# Patient Record
Sex: Male | Born: 1945 | Race: White | Hispanic: No | Smoking: Current every day smoker
Health system: Southern US, Community
[De-identification: ages and names within clinical notes are randomized; demographics above are authoritative.]

## PROBLEM LIST (undated history)

## (undated) DIAGNOSIS — J449 Chronic obstructive pulmonary disease, unspecified: Secondary | ICD-10-CM

## (undated) DIAGNOSIS — I499 Cardiac arrhythmia, unspecified: Secondary | ICD-10-CM

## (undated) DIAGNOSIS — I509 Heart failure, unspecified: Secondary | ICD-10-CM

## (undated) DIAGNOSIS — I739 Peripheral vascular disease, unspecified: Secondary | ICD-10-CM

## (undated) DIAGNOSIS — E119 Type 2 diabetes mellitus without complications: Secondary | ICD-10-CM

## (undated) DIAGNOSIS — M1990 Unspecified osteoarthritis, unspecified site: Secondary | ICD-10-CM

## (undated) DIAGNOSIS — K219 Gastro-esophageal reflux disease without esophagitis: Secondary | ICD-10-CM

## (undated) DIAGNOSIS — F329 Major depressive disorder, single episode, unspecified: Secondary | ICD-10-CM

## (undated) DIAGNOSIS — I639 Cerebral infarction, unspecified: Secondary | ICD-10-CM

## (undated) DIAGNOSIS — I4891 Unspecified atrial fibrillation: Secondary | ICD-10-CM

## (undated) DIAGNOSIS — J189 Pneumonia, unspecified organism: Secondary | ICD-10-CM

## (undated) DIAGNOSIS — I219 Acute myocardial infarction, unspecified: Secondary | ICD-10-CM

## (undated) DIAGNOSIS — I480 Paroxysmal atrial fibrillation: Secondary | ICD-10-CM

## (undated) DIAGNOSIS — E785 Hyperlipidemia, unspecified: Secondary | ICD-10-CM

## (undated) DIAGNOSIS — J45909 Unspecified asthma, uncomplicated: Secondary | ICD-10-CM

## (undated) DIAGNOSIS — I2510 Atherosclerotic heart disease of native coronary artery without angina pectoris: Secondary | ICD-10-CM

## (undated) DIAGNOSIS — R51 Headache: Secondary | ICD-10-CM

## (undated) DIAGNOSIS — H269 Unspecified cataract: Secondary | ICD-10-CM

## (undated) DIAGNOSIS — F32A Depression, unspecified: Secondary | ICD-10-CM

## (undated) DIAGNOSIS — I10 Essential (primary) hypertension: Secondary | ICD-10-CM

## (undated) DIAGNOSIS — R519 Headache, unspecified: Secondary | ICD-10-CM

## (undated) DIAGNOSIS — I255 Ischemic cardiomyopathy: Secondary | ICD-10-CM

## (undated) DIAGNOSIS — F419 Anxiety disorder, unspecified: Secondary | ICD-10-CM

## (undated) HISTORY — PX: FRACTURE SURGERY: SHX138

## (undated) HISTORY — PX: CORONARY STENT PLACEMENT: SHX1402

## (undated) HISTORY — PX: KNEE SURGERY: SHX244

## (undated) HISTORY — PX: OTHER SURGICAL HISTORY: SHX169

## (undated) HISTORY — PX: FACIAL FRACTURE SURGERY: SHX1570

## (undated) HISTORY — PX: FOOT SURGERY: SHX648

## (undated) HISTORY — PX: COLONOSCOPY W/ BIOPSIES AND POLYPECTOMY: SHX1376

---

## 2008-09-24 HISTORY — PX: CARDIAC CATHETERIZATION: SHX172

## 2009-06-04 ENCOUNTER — Ambulatory Visit: Payer: Self-pay | Admitting: Cardiovascular Disease

## 2009-06-04 ENCOUNTER — Encounter (INDEPENDENT_AMBULATORY_CARE_PROVIDER_SITE_OTHER): Payer: Self-pay | Admitting: Cardiology

## 2009-06-04 ENCOUNTER — Inpatient Hospital Stay (HOSPITAL_COMMUNITY): Admission: EM | Admit: 2009-06-04 | Discharge: 2009-06-08 | Payer: Self-pay | Admitting: Emergency Medicine

## 2010-03-17 ENCOUNTER — Encounter: Payer: Self-pay | Admitting: Surgery

## 2010-05-15 LAB — LIPID PANEL
HDL: 60 mg/dL (ref >39)
Total CHOL/HDL Ratio: 3.6 RATIO
Triglycerides: 256 mg/dL — ABNORMAL HIGH (ref <150)
VLDL: 51 mg/dL — ABNORMAL HIGH (ref 0–40)

## 2010-05-15 LAB — COMPREHENSIVE METABOLIC PANEL
ALT: 28 U/L (ref 0–53)
Alkaline Phosphatase: 45 U/L (ref 39–117)
BUN: 11 mg/dL (ref 6–23)
CO2: 24 mEq/L (ref 19–32)
Chloride: 105 mEq/L (ref 96–112)
Glucose, Bld: 128 mg/dL — ABNORMAL HIGH (ref 70–99)
Potassium: 4 mEq/L (ref 3.5–5.1)
Sodium: 137 mEq/L (ref 135–145)
Total Bilirubin: 0.6 mg/dL (ref 0.3–1.2)
Total Protein: 6.2 g/dL (ref 6.0–8.3)

## 2010-05-15 LAB — HEPARIN LEVEL (UNFRACTIONATED)
Heparin Unfractionated: 0.23 IU/mL — ABNORMAL LOW (ref 0.30–0.70)
Heparin Unfractionated: 0.3 IU/mL (ref 0.30–0.70)
Heparin Unfractionated: 0.53 IU/mL (ref 0.30–0.70)
Heparin Unfractionated: 0.62 IU/mL (ref 0.30–0.70)
Heparin Unfractionated: <0.1 IU/mL — ABNORMAL LOW (ref 0.30–0.70)

## 2010-05-15 LAB — CBC
HCT: 38.6 % — ABNORMAL LOW (ref 39.0–52.0)
HCT: 40.8 % (ref 39.0–52.0)
HCT: 41.9 % (ref 39.0–52.0)
HCT: 44.4 % (ref 39.0–52.0)
Hemoglobin: 14.3 g/dL (ref 13.0–17.0)
Hemoglobin: 14.5 g/dL (ref 13.0–17.0)
MCHC: 35.1 g/dL (ref 30.0–36.0)
MCHC: 35.1 g/dL (ref 30.0–36.0)
MCV: 104.3 fL — ABNORMAL HIGH (ref 78.0–100.0)
MCV: 104.7 fL — ABNORMAL HIGH (ref 78.0–100.0)
MCV: 105.7 fL — ABNORMAL HIGH (ref 78.0–100.0)
Platelets: 199 10*3/uL (ref 150–400)
Platelets: 246 10*3/uL (ref 150–400)
Platelets: 288 10*3/uL (ref 150–400)
RBC: 3.75 MIL/uL — ABNORMAL LOW (ref 4.22–5.81)
RBC: 3.89 MIL/uL — ABNORMAL LOW (ref 4.22–5.81)
RDW: 13.8 % (ref 11.5–15.5)
RDW: 13.9 % (ref 11.5–15.5)
RDW: 13.9 % (ref 11.5–15.5)
RDW: 14 % (ref 11.5–15.5)
WBC: 10.1 10*3/uL (ref 4.0–10.5)
WBC: 8.4 10*3/uL (ref 4.0–10.5)
WBC: 9.2 10*3/uL (ref 4.0–10.5)

## 2010-05-15 LAB — DIFFERENTIAL
Basophils Absolute: 0.1 10*3/uL (ref 0.0–0.1)
Basophils Relative: 1 % (ref 0–1)
Eosinophils Absolute: 0.2 10*3/uL (ref 0.0–0.7)
Eosinophils Relative: 2 % (ref 0–5)
Lymphocytes Relative: 32 % (ref 12–46)

## 2010-05-15 LAB — POCT I-STAT, CHEM 8
BUN: 7 mg/dL (ref 6–23)
Creatinine, Ser: 0.9 mg/dL (ref 0.4–1.5)
Glucose, Bld: 113 mg/dL — ABNORMAL HIGH (ref 70–99)
Hemoglobin: 16.7 g/dL (ref 13.0–17.0)
TCO2: 25 mmol/L (ref 0–100)

## 2010-05-15 LAB — URINALYSIS, ROUTINE W REFLEX MICROSCOPIC
Glucose, UA: NEGATIVE mg/dL
Hgb urine dipstick: NEGATIVE
Specific Gravity, Urine: 1.011 (ref 1.005–1.030)
Urobilinogen, UA: 0.2 mg/dL (ref 0.0–1.0)

## 2010-05-15 LAB — VITAMIN B12: Vitamin B-12: 713 pg/mL (ref 211–911)

## 2010-05-15 LAB — PROTIME-INR: Prothrombin Time: 11.9 seconds (ref 11.6–15.2)

## 2010-05-15 LAB — BRAIN NATRIURETIC PEPTIDE
Pro B Natriuretic peptide (BNP): 45 pg/mL (ref 0.0–100.0)
Pro B Natriuretic peptide (BNP): 65 pg/mL (ref 0.0–100.0)

## 2010-05-15 LAB — BASIC METABOLIC PANEL
BUN: 9 mg/dL (ref 6–23)
Chloride: 105 mEq/L (ref 96–112)
Creatinine, Ser: 0.82 mg/dL (ref 0.4–1.5)
Glucose, Bld: 114 mg/dL — ABNORMAL HIGH (ref 70–99)

## 2010-05-15 LAB — TROPONIN I: Troponin I: 0.04 ng/mL (ref 0.00–0.06)

## 2010-05-15 LAB — CK TOTAL AND CKMB (NOT AT ARMC)
Relative Index: INVALID (ref 0.0–2.5)
Total CK: 94 U/L (ref 7–232)

## 2010-05-15 LAB — CARDIAC PANEL(CRET KIN+CKTOT+MB+TROPI)
CK, MB: 1.9 ng/mL (ref 0.3–4.0)
Relative Index: INVALID (ref 0.0–2.5)
Troponin I: 0.02 ng/mL (ref 0.00–0.06)

## 2010-05-15 LAB — MRSA PCR SCREENING: MRSA by PCR: NEGATIVE

## 2010-07-15 ENCOUNTER — Emergency Department (HOSPITAL_COMMUNITY)
Admission: EM | Admit: 2010-07-15 | Discharge: 2010-07-15 | Discharge status: Against Medical Advice | Payer: Medicare Other | Attending: Emergency Medicine | Admitting: Emergency Medicine

## 2010-07-15 DIAGNOSIS — R1032 Left lower quadrant pain: Secondary | ICD-10-CM | POA: Insufficient documentation

## 2011-01-01 ENCOUNTER — Inpatient Hospital Stay (HOSPITAL_BASED_OUTPATIENT_CLINIC_OR_DEPARTMENT_OTHER)
Admission: EM | Admit: 2011-01-01 | Discharge: 2011-01-06 | DRG: 292 | Disposition: A | Discharge status: Home Health | Payer: Medicare Other | Attending: Internal Medicine | Admitting: Internal Medicine

## 2011-01-01 ENCOUNTER — Emergency Department (INDEPENDENT_AMBULATORY_CARE_PROVIDER_SITE_OTHER): Payer: Medicare Other

## 2011-01-01 ENCOUNTER — Other Ambulatory Visit: Payer: Self-pay

## 2011-01-01 DIAGNOSIS — I10 Essential (primary) hypertension: Secondary | ICD-10-CM | POA: Diagnosis present

## 2011-01-01 DIAGNOSIS — R42 Dizziness and giddiness: Secondary | ICD-10-CM | POA: Diagnosis not present

## 2011-01-01 DIAGNOSIS — I517 Cardiomegaly: Secondary | ICD-10-CM

## 2011-01-01 DIAGNOSIS — I5023 Acute on chronic systolic (congestive) heart failure: Principal | ICD-10-CM | POA: Diagnosis present

## 2011-01-01 DIAGNOSIS — J90 Pleural effusion, not elsewhere classified: Secondary | ICD-10-CM

## 2011-01-01 DIAGNOSIS — I2510 Atherosclerotic heart disease of native coronary artery without angina pectoris: Secondary | ICD-10-CM | POA: Diagnosis present

## 2011-01-01 DIAGNOSIS — E785 Hyperlipidemia, unspecified: Secondary | ICD-10-CM | POA: Diagnosis present

## 2011-01-01 DIAGNOSIS — R0602 Shortness of breath: Secondary | ICD-10-CM

## 2011-01-01 DIAGNOSIS — N179 Acute kidney failure, unspecified: Secondary | ICD-10-CM | POA: Diagnosis not present

## 2011-01-01 DIAGNOSIS — I509 Heart failure, unspecified: Secondary | ICD-10-CM | POA: Diagnosis present

## 2011-01-01 DIAGNOSIS — I2589 Other forms of chronic ischemic heart disease: Secondary | ICD-10-CM | POA: Diagnosis present

## 2011-01-01 DIAGNOSIS — I679 Cerebrovascular disease, unspecified: Secondary | ICD-10-CM | POA: Diagnosis present

## 2011-01-01 DIAGNOSIS — R7309 Other abnormal glucose: Secondary | ICD-10-CM | POA: Diagnosis present

## 2011-01-01 DIAGNOSIS — J449 Chronic obstructive pulmonary disease, unspecified: Secondary | ICD-10-CM

## 2011-01-01 DIAGNOSIS — R05 Cough: Secondary | ICD-10-CM

## 2011-01-01 DIAGNOSIS — J4489 Other specified chronic obstructive pulmonary disease: Secondary | ICD-10-CM | POA: Diagnosis present

## 2011-01-01 DIAGNOSIS — J441 Chronic obstructive pulmonary disease with (acute) exacerbation: Secondary | ICD-10-CM

## 2011-01-01 DIAGNOSIS — R739 Hyperglycemia, unspecified: Secondary | ICD-10-CM | POA: Diagnosis present

## 2011-01-01 DIAGNOSIS — I4891 Unspecified atrial fibrillation: Secondary | ICD-10-CM | POA: Diagnosis not present

## 2011-01-01 DIAGNOSIS — F17200 Nicotine dependence, unspecified, uncomplicated: Secondary | ICD-10-CM | POA: Diagnosis present

## 2011-01-01 DIAGNOSIS — I255 Ischemic cardiomyopathy: Secondary | ICD-10-CM | POA: Diagnosis present

## 2011-01-01 DIAGNOSIS — Z720 Tobacco use: Secondary | ICD-10-CM | POA: Diagnosis present

## 2011-01-01 HISTORY — DX: Cerebral infarction, unspecified: I63.9

## 2011-01-01 HISTORY — DX: Chronic obstructive pulmonary disease, unspecified: J44.9

## 2011-01-01 HISTORY — DX: Peripheral vascular disease, unspecified: I73.9

## 2011-01-01 HISTORY — DX: Acute myocardial infarction, unspecified: I21.9

## 2011-01-01 LAB — COMPREHENSIVE METABOLIC PANEL
ALT: 23 U/L (ref 0–53)
AST: 21 U/L (ref 0–37)
Albumin: 3.5 g/dL (ref 3.5–5.2)
Alkaline Phosphatase: 60 U/L (ref 39–117)
Chloride: 102 mEq/L (ref 96–112)
Potassium: 3.5 mEq/L (ref 3.5–5.1)
Sodium: 136 mEq/L (ref 135–145)
Total Protein: 7 g/dL (ref 6.0–8.3)

## 2011-01-01 LAB — PRO B NATRIURETIC PEPTIDE: Pro B Natriuretic peptide (BNP): 816.5 pg/mL — ABNORMAL HIGH (ref 0–125)

## 2011-01-01 LAB — CBC
MCHC: 36.2 g/dL — ABNORMAL HIGH (ref 30.0–36.0)
RDW: 11.8 % (ref 11.5–15.5)
WBC: 7.7 10*3/uL (ref 4.0–10.5)

## 2011-01-01 MED ORDER — ALBUTEROL SULFATE (5 MG/ML) 0.5% IN NEBU
5.0000 mg | INHALATION_SOLUTION | Freq: Once | RESPIRATORY_TRACT | Status: AC
  Administered 2011-01-01: 5 mg via RESPIRATORY_TRACT
  Filled 2011-01-01: qty 1

## 2011-01-01 MED ORDER — METHYLPREDNISOLONE SODIUM SUCC 125 MG IJ SOLR
125.0000 mg | Freq: Once | INTRAMUSCULAR | Status: AC
  Administered 2011-01-01: 125 mg via INTRAVENOUS
  Filled 2011-01-01: qty 2

## 2011-01-01 MED ORDER — NITROGLYCERIN 2 % TD OINT
TOPICAL_OINTMENT | TRANSDERMAL | Status: AC
  Filled 2011-01-01: qty 1

## 2011-01-01 MED ORDER — ONDANSETRON HCL 4 MG/2ML IJ SOLN: 4.0000 mg | Freq: Three times a day (TID) | INTRAMUSCULAR | Status: DC | PRN

## 2011-01-01 MED ORDER — ALPRAZOLAM 0.5 MG PO TABS
0.5000 mg | ORAL_TABLET | Freq: Once | ORAL | Status: AC
  Administered 2011-01-01: 0.5 mg via ORAL

## 2011-01-01 MED ORDER — IOHEXOL 350 MG/ML SOLN
80.0000 mL | Freq: Once | INTRAVENOUS | Status: AC | PRN
  Administered 2011-01-01: 80 mL via INTRAVENOUS

## 2011-01-01 MED ORDER — ALBUTEROL SULFATE (5 MG/ML) 0.5% IN NEBU
2.5000 mg | INHALATION_SOLUTION | RESPIRATORY_TRACT | Status: AC
  Administered 2011-01-01 – 2011-01-02 (×3): 2.5 mg via RESPIRATORY_TRACT
  Filled 2011-01-01: qty 0.5
  Filled 2011-01-01: qty 1
  Filled 2011-01-01: qty 0.5

## 2011-01-01 MED ORDER — ASPIRIN 81 MG PO CHEW
324.0000 mg | CHEWABLE_TABLET | Freq: Once | ORAL | Status: AC
  Administered 2011-01-01: 324 mg via ORAL
  Filled 2011-01-01: qty 1

## 2011-01-01 MED ORDER — IPRATROPIUM BROMIDE 0.02 % IN SOLN
0.5000 mg | Freq: Once | RESPIRATORY_TRACT | Status: AC
  Administered 2011-01-01: 0.5 mg via RESPIRATORY_TRACT
  Filled 2011-01-01: qty 2.5

## 2011-01-01 MED ORDER — ALPRAZOLAM 0.5 MG PO TABS
ORAL_TABLET | ORAL | Status: AC
  Administered 2011-01-01
  Filled 2011-01-01: qty 1

## 2011-01-01 MED ORDER — SODIUM CHLORIDE 0.9 % IV SOLN: INTRAVENOUS | Status: DC

## 2011-01-01 MED ORDER — NITROGLYCERIN 2 % TD OINT: 1.0000 [in_us] | TOPICAL_OINTMENT | Freq: Four times a day (QID) | TRANSDERMAL | Status: DC

## 2011-01-01 MED ORDER — ALBUTEROL SULFATE (5 MG/ML) 0.5% IN NEBU: 2.5000 mg | INHALATION_SOLUTION | Freq: Once | RESPIRATORY_TRACT | Status: DC

## 2011-01-01 NOTE — ED Provider Notes (Signed)
History     CSN: [id number] Arrival date & time: 01/01/2011  2:35 PM   First MD Initiated Contact with Patient 01/01/11 1439      Chief Complaint  Patient presents with  . Shortness of Breath    (Consider location/radiation/quality/duration/timing/severity/associated sxs/prior treatment) HPI Patient states he has been having episodes of shortness of breath over the last week. States the episodes will come on somewhat suddenly and during these times he will feel like he has  being choked. The episodes are severe . Patient states that his Combivent inhaler helps but after a short period of time the symptoms will return. The patient is not sure if  exertion makes it any worse as he states he doesn't really walk much. Patient does have history of COPD as well as coronary artery disease. He denies having any chest pain and does not feel like the symptoms are associated with his coronary artery disease. He has not noticed any swelling. He has not had any trips or travel. Past Medical History  Diagnosis Date  . COPD (chronic obstructive pulmonary disease)   . Myocardial infarction   . Stroke   . PAD (peripheral artery disease)     Past Surgical History  Procedure Date  . Coronary stent placement   . Arm surgery   . Foot surgery   . Head surgery   . Facial fracture surgery   . Knee surgery     No family history on file.  History  Substance Use Topics  . Smoking status: Former Games developer  . Smokeless tobacco: Not on file  . Alcohol Use: No      Review of Systems  Constitutional: Negative for fever.  HENT: Negative for neck pain.   Respiratory: Positive for shortness of breath. Negative for cough.   Cardiovascular: Negative for chest pain.  All other systems reviewed and are negative.    Allergies  Other; Wellbutrin; and Niacin and related  Home Medications  No current outpatient prescriptions on file.  BP 134/84  Pulse 71  Temp(Src) 96.3 F (35.7 C) (Oral)  Ht 5\' 7"   (1.702 m)  Wt 165 lb (74.844 kg)  BMI 25.84 kg/m2  SpO2 94%  Physical Exam  Nursing note and vitals reviewed. Constitutional: He appears well-developed and well-nourished. No distress.  HENT:  Head: Normocephalic and atraumatic.  Right Ear: External ear normal.  Left Ear: External ear normal.  Eyes: Conjunctivae are normal. Right eye exhibits no discharge. Left eye exhibits no discharge. No scleral icterus.  Neck: Neck supple. No tracheal deviation present.  Cardiovascular: Normal rate, regular rhythm and intact distal pulses.   Pulmonary/Chest: No stridor. Tachypnea noted. No respiratory distress. He has decreased breath sounds. He has no wheezes. He has no rales.       Distant sounds bilaterally  Abdominal: Soft. Bowel sounds are normal. He exhibits no distension. There is no tenderness. There is no rebound and no guarding.  Musculoskeletal: He exhibits no edema and no tenderness.       Well healed wound lue, no edema bilateral LE  Neurological: He is alert. He has normal strength. No sensory deficit. Cranial nerve deficit:  no gross defecits noted. He exhibits normal muscle tone. He displays no seizure activity. Coordination normal.  Skin: Skin is warm and dry. No rash noted.  Psychiatric: He has a normal mood and affect.    ED Course  Procedures (including critical care time)  Date: 01/01/2011  Rate: 62  Rhythm: normal sinus rhythm  QRS  Axis: normal  Intervals: first degree av blovk,   ST/T Wave abnormalities: nonspecific st changes  Conduction Disutrbances:nonspecific intraventricular block  Narrative Interpretation: possible inferior and anterolateral infarct,   Old EKG Reviewed: no significant change   Labs Reviewed  PRO B NATRIURETIC PEPTIDE - Abnormal; Notable for the following:    BNP, POC 816.5 (*)    All other components within normal limits  CBC - Abnormal; Notable for the following:    RBC 3.64 (*)    Hemoglobin 12.6 (*)    HCT 34.8 (*)    MCH 34.6 (*)     MCHC 36.2 (*)    All other components within normal limits  COMPREHENSIVE METABOLIC PANEL - Abnormal; Notable for the following:    Glucose, Bld 201 (*)    Total Bilirubin 0.2 (*)    All other components within normal limits  D-DIMER, QUANTITATIVE - Abnormal; Notable for the following:    D-Dimer, Quant 2.99 (*)    All other components within normal limits  TROPONIN I   Dg Chest Portable 1 View  01/01/2011  *RADIOLOGY REPORT*  Clinical Data: Shortness of breath  PORTABLE CHEST - 1 VIEW  Comparison: 06/03/2009  Findings: Mild diffuse interstitial edema or infiltrates bilaterally.  No definite effusion.  Mild cardiomegaly. Atheromatous aortic arch.  Regional bones unremarkable.  IMPRESSION:  1.  Mild cardiomegaly with new diffuse interstitial edema or infiltrates.  Original Report Authenticated By: Osa Craver, M.D.     No diagnosis found.    MDM  DDX: COPD, PE, PNA, Unstable angina, PE, CHF 3:32 PM patient feeling better after the breathing treatment. On repeat exam he now has some increased breath sounds with wheezing noted. At this point I'm suspecting a COPD exacerbation as his primary etiology. At this point we're waiting for his laboratory tests and x-ray studies.       Celene Kras, MD 01/01/11 [phone]

## 2011-01-01 NOTE — ED Provider Notes (Signed)
Medical screening examination/treatment/procedure(s) were conducted as a shared visit with non-physician practitioner(s) and myself.  I personally evaluated the patient during the encounter   Jeffrey Melendez A. Patrica Duel, MD 01/01/11 1753

## 2011-01-01 NOTE — ED Notes (Signed)
Sob x 1 week-denies cough-hx COPD-quit smoking x 3 months

## 2011-01-01 NOTE — ED Provider Notes (Addendum)
History     CSN: [id number] Arrival date & time: 01/01/2011  2:35 PM   First MD Initiated Contact with Patient 01/01/11 1439      Chief Complaint  Patient presents with  . Shortness of Breath    (Consider location/radiation/quality/duration/timing/severity/associated sxs/prior treatment) HPI  Past Medical History  Diagnosis Date  . COPD (chronic obstructive pulmonary disease)   . Myocardial infarction   . Stroke   . PAD (peripheral artery disease)     Past Surgical History  Procedure Date  . Coronary stent placement   . Arm surgery   . Foot surgery   . Head surgery   . Facial fracture surgery   . Knee surgery     No family history on file.  History  Substance Use Topics  . Smoking status: Former Games developer  . Smokeless tobacco: Not on file  . Alcohol Use: No      Review of Systems  Allergies  Other; Wellbutrin; and Niacin and related  Home Medications   Current Outpatient Rx  Name Route Sig Dispense Refill  . ALBUTEROL SULFATE HFA 108 (90 BASE) MCG/ACT IN AERS Inhalation Inhale 2 puffs into the lungs 4 (four) times daily. For shortness of breath and wheezing     . ALPRAZOLAM 1 MG PO TABS Oral Take 1 mg by mouth 3 (three) times daily as needed. For anxiety     . AMLODIPINE BESYLATE 10 MG PO TABS Oral Take 5 mg by mouth daily.      . ASPIRIN 325 MG PO TBEC Oral Take 325 mg by mouth daily.      Marland Kitchen CARBOXYMETHYLCELLULOSE SODIUM 0.25 % OP SOLN Both Eyes Place 1 drop into both eyes 4 (four) times daily.      . CHOLECALCIFEROL 2000 UNITS PO TABS Oral Take 1 tablet by mouth daily.      Marland Kitchen CITALOPRAM HYDROBROMIDE 40 MG PO TABS Oral Take 20 mg by mouth daily.      . ASPIRIN-DIPYRIDAMOLE 25-200 MG PO CP12 Oral Take 1 capsule by mouth 2 (two) times daily.      . OMEGA-3 FATTY ACIDS 1000 MG PO CAPS Oral Take 1 g by mouth daily.      Marland Kitchen FLUNISOLIDE (NASAL) NA Each Nare Place 2 sprays into both nostrils 2 (two) times daily as needed. For allergies     . HYDROXYZINE HCL 25 MG  PO TABS Oral Take 25 mg by mouth 3 (three) times daily as needed. For anxiety      . LISINOPRIL 10 MG PO TABS Oral Take 10 mg by mouth daily.      . MELOXICAM 15 MG PO TABS Oral Take 15 mg by mouth daily.      Marland Kitchen METOPROLOL SUCCINATE 100 MG PO TB24 Oral Take 100 mg by mouth daily.      . MOMETASONE FUROATE 220 MCG/INH IN AEPB Inhalation Inhale 2 puffs into the lungs 2 (two) times daily.      Marland Kitchen ONE-DAILY MULTI VITAMINS PO TABS Oral Take 1 tablet by mouth daily.      Marland Kitchen NITROGLYCERIN 0.4 MG/HR TD PT24 Transdermal Place 1 patch onto the skin daily. Keep patch on from 8AM to 8PM     . OMEPRAZOLE 20 MG PO CPDR Oral Take 20 mg by mouth daily.      Marland Kitchen ROSUVASTATIN CALCIUM 40 MG PO TABS Oral Take 20 mg by mouth daily.      . TRAZODONE HCL 100 MG PO TABS Oral Take 100 mg by  mouth at bedtime as needed. For sleep     . NITROGLYCERIN 0.4 MG SL SUBL Sublingual Place 0.4 mg under the tongue every 5 (five) minutes as needed. For chest pain       BP 134/84  Pulse 71  Temp(Src) 96.3 F (35.7 C) (Oral)  Ht 5\' 7"  (1.702 m)  Wt 165 lb (74.844 kg)  BMI 25.84 kg/m2  SpO2 94%  Physical Exam  ED Course  Procedures (including critical care time)  Labs Reviewed  PRO B NATRIURETIC PEPTIDE - Abnormal; Notable for the following:    BNP, POC 816.5 (*)    All other components within normal limits  CBC - Abnormal; Notable for the following:    RBC 3.64 (*)    Hemoglobin 12.6 (*)    HCT 34.8 (*)    MCH 34.6 (*)    MCHC 36.2 (*)    All other components within normal limits  COMPREHENSIVE METABOLIC PANEL - Abnormal; Notable for the following:    Glucose, Bld 201 (*)    Total Bilirubin 0.2 (*)    All other components within normal limits  D-DIMER, QUANTITATIVE - Abnormal; Notable for the following:    D-Dimer, Quant 2.99 (*)    All other components within normal limits  TROPONIN I   Ct Angio Chest W/cm &/or Wo Cm  01/01/2011  *RADIOLOGY REPORT*  Clinical Data:  Shortness of breath x1 week, cough, COPD,  evaluate for PE  CT ANGIOGRAPHY CHEST WITH CONTRAST  Technique:  Multidetector CT imaging of the chest was performed using the standard protocol during bolus administration of intravenous contrast.  Multiplanar CT image reconstructions including MIPs were obtained to evaluate the vascular anatomy.  Contrast: 80mL OMNIPAQUE IOHEXOL 350 MG/ML IV SOLN  Comparison:  Chest radiograph dated 01/01/2011.  [facility] CT chest dated 06/06/2009.  Findings:  No evidence of pulmonary embolism.  Moderate paraseptal/centrilobular emphysematous changes.  No superimposed opacities suspicious for pneumonia or interstitial edema.  No suspicious pulmonary nodules.  Small bilateral pleural effusions with associated lower lobe atelectasis.  No pneumothorax.  Visualized thyroid is unremarkable.  The heart is top normal in size.  No pericardial effusion. Coronary atherosclerosis.  Atherosclerotic calcifications of the aortic arch.  Visualized upper abdomen is unremarkable.  Visualized osseous structures are within normal limits.  Review of the MIP images confirms the above findings.  IMPRESSION: No evidence of pulmonary embolism.  Moderate paraseptal/centrilobular emphysematous changes.  Small bilateral pleural effusions with associated lower lobe atelectasis.  Original Report Authenticated By: Charline Bills, M.D.   Dg Chest Portable 1 View  01/01/2011  *RADIOLOGY REPORT*  Clinical Data: Shortness of breath  PORTABLE CHEST - 1 VIEW  Comparison: 06/03/2009  Findings: Mild diffuse interstitial edema or infiltrates bilaterally.  No definite effusion.  Mild cardiomegaly. Atheromatous aortic arch.  Regional bones unremarkable.  IMPRESSION:  1.  Mild cardiomegaly with new diffuse interstitial edema or infiltrates.  Original Report Authenticated By: Osa Craver, M.D.     No diagnosis found.    MDM  Pt reports he feels short of breath again.  Pt reports he felt better after nebs bt shortness of breath has returned.  Ct no  pe.  I spoke to Dr. Janee Morn who will admit.        Langston Masker, [place] 01/01/11 1750  Langston Masker, [place] 01/01/11 [phone]

## 2011-01-01 NOTE — ED Notes (Signed)
Report called to [facility]  rm [phone]

## 2011-01-01 NOTE — ED Provider Notes (Signed)
Medical screening examination/treatment/procedure(s) were conducted as a shared visit with non-physician practitioner(s) and myself.  I personally evaluated the patient during the encounter   Please see my note  Celene Kras, MD 01/01/11 1929

## 2011-01-01 NOTE — ED Notes (Signed)
BBS clear & diminished, RR 24-26. Patient stated he just took combivent MDI before he left home. No distress noted. RT will continue to monior.

## 2011-01-01 NOTE — ED Notes (Signed)
Patient given frozen meal and coffee at this time. Will continue to monitor.

## 2011-01-02 DIAGNOSIS — I2510 Atherosclerotic heart disease of native coronary artery without angina pectoris: Secondary | ICD-10-CM | POA: Diagnosis present

## 2011-01-02 DIAGNOSIS — I5023 Acute on chronic systolic (congestive) heart failure: Secondary | ICD-10-CM | POA: Diagnosis present

## 2011-01-02 DIAGNOSIS — I739 Peripheral vascular disease, unspecified: Secondary | ICD-10-CM | POA: Insufficient documentation

## 2011-01-02 DIAGNOSIS — I10 Essential (primary) hypertension: Secondary | ICD-10-CM | POA: Diagnosis present

## 2011-01-02 DIAGNOSIS — Z720 Tobacco use: Secondary | ICD-10-CM | POA: Diagnosis present

## 2011-01-02 DIAGNOSIS — J449 Chronic obstructive pulmonary disease, unspecified: Secondary | ICD-10-CM | POA: Diagnosis present

## 2011-01-02 DIAGNOSIS — I255 Ischemic cardiomyopathy: Secondary | ICD-10-CM | POA: Diagnosis present

## 2011-01-02 DIAGNOSIS — I679 Cerebrovascular disease, unspecified: Secondary | ICD-10-CM | POA: Diagnosis present

## 2011-01-02 DIAGNOSIS — E785 Hyperlipidemia, unspecified: Secondary | ICD-10-CM | POA: Diagnosis present

## 2011-01-02 LAB — COMPREHENSIVE METABOLIC PANEL
Albumin: 3.4 g/dL — ABNORMAL LOW (ref 3.5–5.2)
BUN: 11 mg/dL (ref 6–23)
Calcium: 9.8 mg/dL (ref 8.4–10.5)
Chloride: 100 mEq/L (ref 96–112)
Creatinine, Ser: 0.76 mg/dL (ref 0.50–1.35)
Total Bilirubin: 0.1 mg/dL — ABNORMAL LOW (ref 0.3–1.2)
Total Protein: 7 g/dL (ref 6.0–8.3)

## 2011-01-02 LAB — CBC
HCT: 35.9 % — ABNORMAL LOW (ref 39.0–52.0)
Hemoglobin: 12.6 g/dL — ABNORMAL LOW (ref 13.0–17.0)
MCH: 34.1 pg — ABNORMAL HIGH (ref 26.0–34.0)
MCHC: 35.1 g/dL (ref 30.0–36.0)
MCV: 97.3 fL (ref 78.0–100.0)

## 2011-01-02 LAB — DIFFERENTIAL
Basophils Relative: 0 % (ref 0–1)
Eosinophils Absolute: 0 10*3/uL (ref 0.0–0.7)
Eosinophils Relative: 0 % (ref 0–5)
Monocytes Absolute: 0.4 10*3/uL (ref 0.1–1.0)
Monocytes Relative: 3 % (ref 3–12)

## 2011-01-02 LAB — CARDIAC PANEL(CRET KIN+CKTOT+MB+TROPI)
CK, MB: 3.5 ng/mL (ref 0.3–4.0)
Total CK: 120 U/L (ref 7–232)

## 2011-01-02 LAB — PRO B NATRIURETIC PEPTIDE: Pro B Natriuretic peptide (BNP): 1046 pg/mL — ABNORMAL HIGH (ref 0–125)

## 2011-01-02 MED ORDER — AMLODIPINE BESYLATE 5 MG PO TABS
5.0000 mg | ORAL_TABLET | Freq: Every day | ORAL | Status: DC
  Administered 2011-01-02 – 2011-01-06 (×5): 5 mg via ORAL
  Filled 2011-01-02 (×5): qty 1

## 2011-01-02 MED ORDER — TRAZODONE HCL 100 MG PO TABS
100.0000 mg | ORAL_TABLET | Freq: Every evening | ORAL | Status: DC | PRN
  Filled 2011-01-02: qty 1

## 2011-01-02 MED ORDER — CITALOPRAM HYDROBROMIDE 20 MG PO TABS
20.0000 mg | ORAL_TABLET | Freq: Every day | ORAL | Status: DC
  Administered 2011-01-02 – 2011-01-06 (×5): 20 mg via ORAL
  Filled 2011-01-02 (×5): qty 1

## 2011-01-02 MED ORDER — ASPIRIN EC 325 MG PO TBEC
325.0000 mg | DELAYED_RELEASE_TABLET | Freq: Every day | ORAL | Status: DC
  Administered 2011-01-02 – 2011-01-06 (×6): 325 mg via ORAL
  Filled 2011-01-02 (×5): qty 1

## 2011-01-02 MED ORDER — ONE-DAILY MULTI VITAMINS PO TABS: 1.0000 | ORAL_TABLET | Freq: Every day | ORAL | Status: DC

## 2011-01-02 MED ORDER — OMEGA-3-ACID ETHYL ESTERS 1 G PO CAPS
1.0000 g | ORAL_CAPSULE | Freq: Every day | ORAL | Status: DC
  Administered 2011-01-02 – 2011-01-06 (×5): 1 g via ORAL
  Filled 2011-01-02 (×5): qty 1

## 2011-01-02 MED ORDER — ALBUTEROL SULFATE HFA 108 (90 BASE) MCG/ACT IN AERS
2.0000 | INHALATION_SPRAY | Freq: Four times a day (QID) | RESPIRATORY_TRACT | Status: DC
  Administered 2011-01-02 – 2011-01-04 (×9): 2 via RESPIRATORY_TRACT
  Filled 2011-01-02: qty 6.7

## 2011-01-02 MED ORDER — CARBOXYMETHYLCELLULOSE SODIUM 0.25 % OP SOLN: 1.0000 [drp] | Freq: Four times a day (QID) | OPHTHALMIC | Status: DC

## 2011-01-02 MED ORDER — ALBUTEROL SULFATE (5 MG/ML) 0.5% IN NEBU
5.0000 mg | INHALATION_SOLUTION | Freq: Once | RESPIRATORY_TRACT | Status: DC
  Filled 2011-01-02: qty 0.5

## 2011-01-02 MED ORDER — FUROSEMIDE 10 MG/ML IJ SOLN
40.0000 mg | Freq: Two times a day (BID) | INTRAMUSCULAR | Status: DC
  Administered 2011-01-02 – 2011-01-04 (×5): 40 mg via INTRAVENOUS
  Filled 2011-01-02 (×8): qty 4

## 2011-01-02 MED ORDER — ALPRAZOLAM 1 MG PO TABS
1.0000 mg | ORAL_TABLET | Freq: Three times a day (TID) | ORAL | Status: DC | PRN
  Administered 2011-01-02 – 2011-01-06 (×7): 1 mg via ORAL
  Filled 2011-01-02 (×7): qty 1

## 2011-01-02 MED ORDER — LISINOPRIL 10 MG PO TABS
10.0000 mg | ORAL_TABLET | Freq: Every day | ORAL | Status: DC
  Administered 2011-01-02 – 2011-01-04 (×3): 10 mg via ORAL
  Filled 2011-01-02 (×3): qty 1

## 2011-01-02 MED ORDER — LISINOPRIL 20 MG PO TABS: 20.0000 mg | ORAL_TABLET | Freq: Every day | ORAL | Status: DC

## 2011-01-02 MED ORDER — PANTOPRAZOLE SODIUM 40 MG PO TBEC
40.0000 mg | DELAYED_RELEASE_TABLET | Freq: Every day | ORAL | Status: DC
  Administered 2011-01-02 – 2011-01-06 (×5): 40 mg via ORAL
  Filled 2011-01-02 (×5): qty 1

## 2011-01-02 MED ORDER — VITAMIN D 1000 UNITS PO TABS
1000.0000 [IU] | ORAL_TABLET | Freq: Every day | ORAL | Status: DC
  Administered 2011-01-02 – 2011-01-06 (×5): 1000 [IU] via ORAL
  Filled 2011-01-02 (×5): qty 1

## 2011-01-02 MED ORDER — THERA M PLUS PO TABS
1.0000 | ORAL_TABLET | Freq: Every day | ORAL | Status: DC
  Administered 2011-01-02: 1 via ORAL
  Administered 2011-01-03: 10:00:00 via ORAL
  Administered 2011-01-04 – 2011-01-06 (×3): 1 via ORAL
  Filled 2011-01-02 (×5): qty 1

## 2011-01-02 MED ORDER — ALBUTEROL SULFATE (5 MG/ML) 0.5% IN NEBU: 5.0000 mg | INHALATION_SOLUTION | Freq: Once | RESPIRATORY_TRACT | Status: DC

## 2011-01-02 MED ORDER — FLUTICASONE PROPIONATE HFA 44 MCG/ACT IN AERO
1.0000 | INHALATION_SPRAY | Freq: Two times a day (BID) | RESPIRATORY_TRACT | Status: DC
  Administered 2011-01-02 – 2011-01-05 (×8): 1 via RESPIRATORY_TRACT
  Filled 2011-01-02 (×2): qty 10.6

## 2011-01-02 MED ORDER — OMEGA-3 FATTY ACIDS 1000 MG PO CAPS: 1.0000 g | ORAL_CAPSULE | Freq: Every day | ORAL | Status: DC

## 2011-01-02 MED ORDER — POLYVINYL ALCOHOL 1.4 % OP SOLN
1.0000 [drp] | Freq: Four times a day (QID) | OPHTHALMIC | Status: DC
  Administered 2011-01-02 – 2011-01-06 (×14): 1 [drp] via OPHTHALMIC
  Filled 2011-01-02: qty 15

## 2011-01-02 MED ORDER — ROSUVASTATIN CALCIUM 20 MG PO TABS
20.0000 mg | ORAL_TABLET | Freq: Every day | ORAL | Status: DC
  Administered 2011-01-02 – 2011-01-06 (×5): 20 mg via ORAL
  Filled 2011-01-02 (×5): qty 1

## 2011-01-02 MED ORDER — ASPIRIN-DIPYRIDAMOLE ER 25-200 MG PO CP12
1.0000 | ORAL_CAPSULE | Freq: Two times a day (BID) | ORAL | Status: DC
  Administered 2011-01-02 – 2011-01-06 (×8): 1 via ORAL
  Filled 2011-01-02 (×11): qty 1

## 2011-01-02 MED ORDER — NITROGLYCERIN 0.4 MG SL SUBL: 0.4000 mg | SUBLINGUAL_TABLET | SUBLINGUAL | Status: DC | PRN

## 2011-01-02 MED ORDER — METOPROLOL SUCCINATE ER 100 MG PO TB24
100.0000 mg | ORAL_TABLET | Freq: Every day | ORAL | Status: DC
  Administered 2011-01-02 – 2011-01-06 (×5): 100 mg via ORAL
  Filled 2011-01-02 (×5): qty 1

## 2011-01-02 MED ORDER — ENOXAPARIN SODIUM 40 MG/0.4ML ~~LOC~~ SOLN
40.0000 mg | SUBCUTANEOUS | Status: DC
  Administered 2011-01-02 – 2011-01-06 (×5): 40 mg via SUBCUTANEOUS
  Filled 2011-01-02 (×5): qty 0.4

## 2011-01-02 NOTE — Progress Notes (Signed)
  Echocardiogram 2D Echocardiogram has been performed.  Juanita Laster Romney Compean, RDCS 01/02/2011, 8:57 AM

## 2011-01-02 NOTE — Progress Notes (Signed)
Subjective: Jeffrey Melendez feels a little less dyspneic this morning. He denies any significant fever or cough. He reports that he has not had any tobacco in about 10 months.  He denies any frank chest pain but does have a feeling of tightness across his chest.  Objective: Vital signs in last 24 hours: Temp:  [96.3 F (35.7 C)-97.7 F (36.5 C)] 97.7 F (36.5 C) (11/08 0457) Pulse Rate:  [69-76] 74  (11/08 0457) Resp:  [20] 20  (11/08 0457) BP: (131-159)/(69-89) 157/79 mmHg (11/08 0457) SpO2:  [94 %-97 %] 97 % (11/08 0942) FiO2 (%):  [2 %] 2 % (11/07 2129) Weight:  [74.844 kg (165 lb)-76.3 kg (168 lb 3.4 oz)] 168 lb 3.4 oz (76.3 kg) (11/07 2129) Weight change:  Last BM Date: 01/01/11  Intake/Output from previous day: 11/07 0701 - 11/08 0700 In: -  Out: 200 [Urine:200] Total I/O In: 300 [P.O.:300] Out: 2000 [Urine:2000]   Physical Exam: General Appearance:    Alert, cooperative, no distress, appears stated age  Lungs:     Diminished to auscultation bilaterally, respirations unlabored, no crackles or wheezes.   Heart:    Regular rate and rhythm, S1 and S2 normal, no murmur, rub   or gallop  Abdomen:     Soft, non-tender, bowel sounds active all four quadrants,    no masses, no organomegaly  Extremities:   Extremities normal, atraumatic, no cyanosis or edema  Pulses:   1+ and symmetric all extremities     Lab Results: Results for orders placed during the hospital encounter of 01/01/11 (from the past 48 hour(s))  PRO B NATRIURETIC PEPTIDE     Status: Abnormal   Collection Time   01/01/11  3:11 PM      Component Value Range Comment   BNP, POC 816.5 (*) 0 - 125 (pg/mL)   CBC     Status: Abnormal   Collection Time   01/01/11  3:11 PM      Component Value Range Comment   WBC 7.7  4.0 - 10.5 (K/uL)    RBC 3.64 (*) 4.22 - 5.81 (MIL/uL)    Hemoglobin 12.6 (*) 13.0 - 17.0 (g/dL)    HCT 40.9 (*) 81.1 - 52.0 (%)    MCV 95.6  78.0 - 100.0 (fL)    MCH 34.6 (*) 26.0 - 34.0 (pg)    MCHC  36.2 (*) 30.0 - 36.0 (g/dL)    RDW 91.4  78.2 - 95.6 (%)    Platelets 186  150 - 400 (K/uL)   COMPREHENSIVE METABOLIC PANEL     Status: Abnormal   Collection Time   01/01/11  3:11 PM      Component Value Range Comment   Sodium 136  135 - 145 (mEq/L)    Potassium 3.5  3.5 - 5.1 (mEq/L)    Chloride 102  96 - 112 (mEq/L)    CO2 22  19 - 32 (mEq/L)    Glucose, Bld 201 (*) 70 - 99 (mg/dL)    BUN 8  6 - 23 (mg/dL)    Creatinine, Ser 2.13  0.50 - 1.35 (mg/dL)    Calcium 9.2  8.4 - 10.5 (mg/dL)    Total Protein 7.0  6.0 - 8.3 (g/dL)    Albumin 3.5  3.5 - 5.2 (g/dL)    AST 21  0 - 37 (U/L)    ALT 23  0 - 53 (U/L)    Alkaline Phosphatase 60  39 - 117 (U/L)    Total Bilirubin  0.2 (*) 0.3 - 1.2 (mg/dL)    GFR calc non Af Amer >90  >90 (mL/min)    GFR calc Af Amer >90  >90 (mL/min)   TROPONIN I     Status: Normal   Collection Time   01/01/11  3:11 PM      Component Value Range Comment   Troponin I <0.30  <0.30 (ng/mL)   D-DIMER, QUANTITATIVE     Status: Abnormal   Collection Time   01/01/11  3:11 PM      Component Value Range Comment   D-Dimer, Quant 2.99 (*) 0.00 - 0.48 (ug/mL-FEU)   MAGNESIUM     Status: Abnormal   Collection Time   01/02/11  4:45 AM      Component Value Range Comment   Magnesium 1.4 (*) 1.5 - 2.5 (mg/dL)   CBC     Status: Abnormal   Collection Time   01/02/11  4:45 AM      Component Value Range Comment   WBC 11.7 (*) 4.0 - 10.5 (K/uL)    RBC 3.69 (*) 4.22 - 5.81 (MIL/uL)    Hemoglobin 12.6 (*) 13.0 - 17.0 (g/dL)    HCT 16.1 (*) 09.6 - 52.0 (%)    MCV 97.3  78.0 - 100.0 (fL)    MCH 34.1 (*) 26.0 - 34.0 (pg)    MCHC 35.1  30.0 - 36.0 (g/dL)    RDW 04.5  40.9 - 81.1 (%)    Platelets 191  150 - 400 (K/uL)   DIFFERENTIAL     Status: Abnormal   Collection Time   01/02/11  4:45 AM      Component Value Range Comment   Neutrophils Relative 89 (*) 43 - 77 (%)    Neutro Abs 10.4 (*) 1.7 - 7.7 (K/uL)    Lymphocytes Relative 8 (*) 12 - 46 (%)    Lymphs Abs 0.9  0.7 - 4.0  (K/uL)    Monocytes Relative 3  3 - 12 (%)    Monocytes Absolute 0.4  0.1 - 1.0 (K/uL)    Eosinophils Relative 0  0 - 5 (%)    Eosinophils Absolute 0.0  0.0 - 0.7 (K/uL)    Basophils Relative 0  0 - 1 (%)    Basophils Absolute 0.0  0.0 - 0.1 (K/uL)   COMPREHENSIVE METABOLIC PANEL     Status: Abnormal   Collection Time   01/02/11  4:45 AM      Component Value Range Comment   Sodium 137  135 - 145 (mEq/L)    Potassium 3.9  3.5 - 5.1 (mEq/L)    Chloride 100  96 - 112 (mEq/L)    CO2 23  19 - 32 (mEq/L)    Glucose, Bld 223 (*) 70 - 99 (mg/dL)    BUN 11  6 - 23 (mg/dL)    Creatinine, Ser 9.14  0.50 - 1.35 (mg/dL)    Calcium 9.8  8.4 - 10.5 (mg/dL)    Total Protein 7.0  6.0 - 8.3 (g/dL)    Albumin 3.4 (*) 3.5 - 5.2 (g/dL)    AST 16  0 - 37 (U/L)    ALT 21  0 - 53 (U/L)    Alkaline Phosphatase 58  39 - 117 (U/L)    Total Bilirubin 0.1 (*) 0.3 - 1.2 (mg/dL)    GFR calc non Af Amer >90  >90 (mL/min)    GFR calc Af Amer >90  >90 (mL/min)   CARDIAC PANEL(CRET KIN+CKTOT+MB+TROPI)  Status: Abnormal   Collection Time   01/02/11  4:45 AM      Component Value Range Comment   Total CK 120  7 - 232 (U/L)    CK, MB 3.5  0.3 - 4.0 (ng/mL)    Troponin I <0.30  <0.30 (ng/mL)    Relative Index 2.9 (*) 0.0 - 2.5    PRO B NATRIURETIC PEPTIDE     Status: Abnormal   Collection Time   01/02/11  4:45 AM      Component Value Range Comment   BNP, POC 1046.0 (*) 0 - 125 (pg/mL)    Studies/Results: Ct Angio Chest W/cm &/or Wo Cm  01/01/2011  *RADIOLOGY REPORT*  Clinical Data:  Shortness of breath x1 week, cough, COPD, evaluate for PE  CT ANGIOGRAPHY CHEST WITH CONTRAST  Technique:  Multidetector CT imaging of the chest was performed using the standard protocol during bolus administration of intravenous contrast.  Multiplanar CT image reconstructions including MIPs were obtained to evaluate the vascular anatomy.  Contrast: 80mL OMNIPAQUE IOHEXOL 350 MG/ML IV SOLN  Comparison:  Chest radiograph dated  01/01/2011.  [facility] CT chest dated 06/06/2009.  Findings:  No evidence of pulmonary embolism.  Moderate paraseptal/centrilobular emphysematous changes.  No superimposed opacities suspicious for pneumonia or interstitial edema.  No suspicious pulmonary nodules.  Small bilateral pleural effusions with associated lower lobe atelectasis.  No pneumothorax.  Visualized thyroid is unremarkable.  The heart is top normal in size.  No pericardial effusion. Coronary atherosclerosis.  Atherosclerotic calcifications of the aortic arch.  Visualized upper abdomen is unremarkable.  Visualized osseous structures are within normal limits.  Review of the MIP images confirms the above findings.  IMPRESSION: No evidence of pulmonary embolism.  Moderate paraseptal/centrilobular emphysematous changes.  Small bilateral pleural effusions with associated lower lobe atelectasis.  Original Report Authenticated By: Charline Bills, M.D.   Dg Chest Portable 1 View  01/01/2011  *RADIOLOGY REPORT*  Clinical Data: Shortness of breath  PORTABLE CHEST - 1 VIEW  Comparison: 06/03/2009  Findings: Mild diffuse interstitial edema or infiltrates bilaterally.  No definite effusion.  Mild cardiomegaly. Atheromatous aortic arch.  Regional bones unremarkable.  IMPRESSION:  1.  Mild cardiomegaly with new diffuse interstitial edema or infiltrates.  Original Report Authenticated By: Osa Craver, M.D.    Medications: Scheduled Meds:   . albuterol  2 puff Inhalation QID  . albuterol  2.5 mg Nebulization Q4H  . albuterol  5 mg Nebulization Once  . albuterol  5 mg Nebulization Once  . albuterol  5 mg Nebulization Once  . albuterol  5 mg Nebulization Once  . albuterol  5 mg Nebulization Once  . ALPRAZolam  0.5 mg Oral Once  . amLODipine  5 mg Oral Daily  . aspirin  324 mg Oral Once  . aspirin  325 mg Oral Daily  . cholecalciferol  1,000 Units Oral Daily  . citalopram  20 mg Oral Daily  . dipyridamole-aspirin  1 capsule Oral BID    . enoxaparin  40 mg Subcutaneous Q24H  . fluticasone  1 puff Inhalation BID  . furosemide  40 mg Intravenous Q12H  . ipratropium  0.5 mg Nebulization Once  . lisinopril  10 mg Oral Daily  . methylPREDNISolone (SOLU-MEDROL) injection  125 mg Intravenous Once  . metoprolol  100 mg Oral Daily  . multivitamins ther. w/minerals  1 tablet Oral Daily  . nitroGLYCERIN      . omega-3 acid ethyl esters  1 g Oral Daily  .  pantoprazole  40 mg Oral Q1200  . polyvinyl alcohol  1 drop Both Eyes QID  . rosuvastatin  20 mg Oral Daily  . DISCONTD: sodium chloride   Intravenous STAT  . DISCONTD: albuterol  2.5 mg Nebulization Once  . DISCONTD: ALPRAZolam      . DISCONTD: Carboxymethylcellulose Sodium  1 drop Both Eyes QID  . DISCONTD: fish oil-omega-3 fatty acids  1 g Oral Daily  . DISCONTD: lisinopril  20 mg Oral Daily  . DISCONTD: multivitamin  1 tablet Oral Daily  . DISCONTD: multivitamin  1 tablet Oral Daily  . DISCONTD: nitroGLYCERIN  1 inch Topical Q6H   Continuous Infusions:  PRN Meds:.ALPRAZolam, iohexol, nitroGLYCERIN, traZODone, DISCONTD: ondansetron (ZOFRAN) IV  Assessment/Plan:  Principal Problem:  *Systolic CHF, acute on chronic: The patient diuresed 1.7 L yesterday and is currently on Lasix twice a day. His symptoms are beginning to improve. A two-dimensional echocardiogram was done which does confirm systolic dysfunction. He has a long-standing history of ischemic cardiomyopathy but his cardiac enzymes have been negative and he denies any chest pain. At this time, would continue diuretic therapy and continue to monitor him closely.  Check a chest x-ray in the morning. Check a pro BNP level in the morning. Active Problems:  COPD (chronic obstructive pulmonary disease): No evidence of acute exacerbation. Continue bronchodilator therapy. No indication for steroids at present.  Continue Advair.  Ischemic cardiomyopathy: The patient has seen Dr. Donnie Aho for this in the past. His cardiac  markers are negative to date. We will continue to monitor his cardiac markers for a full set of 3.  He is on aspirin therapy.  CAD (coronary artery disease): Cardiac markers are negative to date. Continue aspirin therapy.  Tobacco abuse: The patient reports that he quit smoking approximately 10 months ago.  HTN (hypertension): Continue Norvasc, metoprolol, and lisinopril.  Cerebrovascular disease: Continue Aggrenox, and risk factor reduction.  Hyperlipidemia: Continue fish oil and statin therapy.    LOS: 1 day   Vanderbilt Ranieri 01/02/2011, 11:57 AM

## 2011-01-02 NOTE — H&P (Signed)
  [phone] 

## 2011-01-02 NOTE — Progress Notes (Addendum)
Inpatient Diabetes Program Recommendations  AACE/ADA: New Consensus Statement on Inpatient Glycemic Control (2009)  Target Ranges:  Prepandial:   less than 140 mg/dL      Peak postprandial:   less than 180 mg/dL (1-2 hours)      Critically ill patients:  140 - 180 mg/dL   Reason for Visit: Lab glucose 201, 223  Inpatient Diabetes Program Recommendations Correction (SSI): Start Novolog sensitive scale TID

## 2011-01-02 NOTE — Plan of Care (Signed)
Problem: Consults Goal: Respiratory Problems Patient Education See Patient Education Module for education specifics. Pt educated about o2

## 2011-01-02 NOTE — H&P (Signed)
Jeffrey Melendez, Jeffrey Melendez NO.:  [id number]  MEDICAL RECORD NO.:  [id number]  LOCATION:  1423                         FACILITY:  [facility]  PHYSICIAN:  Talmage Nap, MD  DATE OF BIRTH:  [birth year]  DATE OF ADMISSION:  01/01/2011 DATE OF DISCHARGE:                             HISTORY & PHYSICAL   PRIMARY CARE PHYSICIAN:  Unknown.  History essentially obtainable from the patient.  CHIEF COMPLAINT:  Shortness of breath of about 1 week duration.  Patient is a 65 year old Caucasian male with history of a COPD, prior MI, coronary artery disease, status post stent, and also has a history of a CVA, presently no neuro deficit.  Initially, presented to [facility] because of shortness of breath of about a week's duration and this was said to be getting progressively worse.  The patient denied any history of chest pain.  He denied any fever.  He denied any chills.  He denied any rigor.  Associated with shortness of breath is slight swelling of the lower extremity.  The patient also gives a history of 3 pillow orthopnea.  The patient denied any fever. He denied any chills.  He denied any rigor.  He denied any history of abdominal discomfort.  He denied any diarrhea or hematochezia.  The shortness of breath was said to be getting progressively worse hence [facility].  After the patient was evaluated at [facility] he was said to have COPD exacerbation and subsequently transferred to [facility].  PAST MEDICAL HISTORY: 1. Positive for coronary artery disease. 2. COPD. 3. Prior MI. 4. CVA. 5. Peripheral artery disease.  PAST SURGICAL HISTORY:  Coronary artery disease, status post stent, left foot surgeries, facial fracture, status post surgery, and also left knee surgery.  MEDICATIONS:  His preadmission meds without dosages include the following; 1. Albuterol inhaler. 2. Alprazolam. 3. Amlodipine. 4. Aspirin. 5.  Carboxymethyl cellulose sodium solution. 6. Calciferol. 7. Citalopram. 8. Dipyridamole/aspirin (Aggrenox). 9. Fish oil omega-3 fatty acid. 10.Flunisolide nasal spray. 11.Hydroxyzine (Atarax/Vistaril). 12.Lisinopril. 13.Meloxicam. 14.Metoprolol. 15.Mometasone inhaler. 16.Multivitamin. 17.Nitroglycerin sublingual. 18.Nitroglycerin transdermal. 19.Omeprazole. 20.Rosuvastatin. 21.Trazodone.  ALLERGIES: 1. WELLBUTRIN. 2. NIACIN.  SOCIAL HISTORY:  Negative for alcohol and tobacco use.  FAMILY HISTORY:  York Spaniel to be negative for any coronary artery disease.  REVIEW OF SYSTEMS:  The patient denies any history of headaches.  No blurry vision.  He complained of mild shortness of breath.  Denied any associated chest pain.  No diaphoresis.  Complained of orthopnea and PND.  No abdominal discomfort.  No diarrhea or hematochezia.  No dysuria or hematuria.  Occasional swelling of the lower extremities.  No intolerance to heat or cold.  No neuropsychiatric disorder.  PHYSICAL EXAMINATION:  GENERAL:  Slightly elderly, not in any obvious respiratory distress. VITAL SIGNS:  Blood pressure is 159/89, pulse 69, respiratory rate 20, and temperature is 97.7. HEENT:  Pupils are reactive to light and extraocular muscles are intact. NECK:  No jugular venous distention.  No carotid bruit.  No lymphadenopathy. CHEST:  Showed crackles lower zones of the lungs bilaterally. HEART:  Sounds are 1 and 2. ABDOMEN:  Soft and  nontender.  Liver, spleen, and kidney not palpable. Bowel sounds are positive. EXTREMITIES:  Showed trace edema. NEUROLOGIC:  Nonfocal. MUSCULOSKELETAL SYSTEM:  Show arthritic changes in the knees and in the feet. NEUROPSYCHIATRIC:  Evaluation is unremarkable. SKIN:  Showed normal turgor.  LAB DATA:  Chemistry showed sodium of 136, potassium of 3.5 chloride if 102 with a bicarb of 22, BUN is 8, creatinine 0.70, and glucose is 201. LFTs are normal.  First set of cardiac markers,  troponin-I less than 0.30.  Hematological indices showed WBC of 7.7, hemoglobin of 12.6, hematocrit 30.8, MCV of 95.6 with a platelet count of 186.  D-dimer is 2.99.  BNP is 816.  IMAGING STUDIES:  Done include CT angiogram, shows no evidence of pulmonary embolism.  There is moderate paraseptal/centrilobular emphysematous changes and small bilateral pleural effusion with associated lower lobe atelectasis.  Portable chest x-ray showed marked cardiomegaly with new diffuse interstitial edema or infiltrate.  The patient also had a 12-lead EKG done.  PROBLEM LIST: 1. Shortness of breath. 2. PND. 3. Orthopnea. 4. Trace edema lower extremities.  ADMITTING IMPRESSION: 1. CHF (not otherwise specified). 2. Coronary artery disease, status post stent. 3. COPD. 4. Peripheral artery disease. 5. History of CVA, no neuro deficit.  PLAN:  Admit the patient to telemetry.  The patient will be on oxygen via nasal cannula.  He will be on albuterol and Atrovent nebs.  The patient will be on lisinopril 10 mg p.o. daily as well as metoprolol 100 mg p.o. daily.  Other medication to be given to the patient will include nitroglycerin p.r.n. for chest pain.  He will also be on rosuvastatin 20 mg p.o. daily.  The patient will be on aspirin 325 mg p.o. daily as well as Aggrenox 1 p.o. daily.  Further workup to be done on this patient will include cardiac enzymes q.6 hours x3, 2D echo, CBC, CMP, and magnesium will be repeated in a.m. as well as proBNP.  The patient will be followed and evaluated on day-to-day basis.     Talmage Nap, MD     CN/MEDQ  D:  01/02/2011  T:  01/02/2011  Job:  [id number]

## 2011-01-03 ENCOUNTER — Other Ambulatory Visit: Payer: Self-pay

## 2011-01-03 ENCOUNTER — Inpatient Hospital Stay (HOSPITAL_COMMUNITY): Payer: Medicare Other

## 2011-01-03 DIAGNOSIS — I4891 Unspecified atrial fibrillation: Secondary | ICD-10-CM | POA: Diagnosis not present

## 2011-01-03 DIAGNOSIS — R739 Hyperglycemia, unspecified: Secondary | ICD-10-CM | POA: Diagnosis present

## 2011-01-03 LAB — BASIC METABOLIC PANEL
BUN: 16 mg/dL (ref 6–23)
Chloride: 99 mEq/L (ref 96–112)
Creatinine, Ser: 0.92 mg/dL (ref 0.50–1.35)
GFR calc Af Amer: >90 mL/min (ref >90)

## 2011-01-03 LAB — CARDIAC PANEL(CRET KIN+CKTOT+MB+TROPI): Total CK: 163 U/L (ref 7–232)

## 2011-01-03 LAB — GLUCOSE, CAPILLARY: Glucose-Capillary: 221 mg/dL — ABNORMAL HIGH (ref 70–99)

## 2011-01-03 LAB — CBC
HCT: 38.7 % — ABNORMAL LOW (ref 39.0–52.0)
MCHC: 34.4 g/dL (ref 30.0–36.0)
MCV: 98.7 fL (ref 78.0–100.0)
RDW: 12.9 % (ref 11.5–15.5)

## 2011-01-03 MED ORDER — ACETAMINOPHEN 325 MG PO TABS
650.0000 mg | ORAL_TABLET | ORAL | Status: DC | PRN
  Administered 2011-01-03: 650 mg via ORAL
  Filled 2011-01-03: qty 2

## 2011-01-03 MED ORDER — SODIUM CHLORIDE 0.9 % IV BOLUS (SEPSIS)
500.0000 mL | Freq: Once | INTRAVENOUS | Status: AC
  Administered 2011-01-03: 500 mL via INTRAVENOUS

## 2011-01-03 MED ORDER — HYDROCODONE-ACETAMINOPHEN 5-325 MG PO TABS
1.0000 | ORAL_TABLET | ORAL | Status: DC | PRN
  Administered 2011-01-03: 2 via ORAL
  Administered 2011-01-04 – 2011-01-05 (×4): 1 via ORAL
  Filled 2011-01-03 (×4): qty 1
  Filled 2011-01-03: qty 2

## 2011-01-03 MED ORDER — HYDROXYZINE HCL 25 MG PO TABS
25.0000 mg | ORAL_TABLET | Freq: Three times a day (TID) | ORAL | Status: DC | PRN
  Administered 2011-01-03: 25 mg via ORAL
  Filled 2011-01-03: qty 1

## 2011-01-03 MED ORDER — INSULIN ASPART 100 UNIT/ML ~~LOC~~ SOLN
0.0000 [IU] | Freq: Every day | SUBCUTANEOUS | Status: DC
  Administered 2011-01-05: 2 [IU] via SUBCUTANEOUS

## 2011-01-03 MED ORDER — METOPROLOL TARTRATE 1 MG/ML IV SOLN: 5.0000 mg | INTRAVENOUS | Status: DC | PRN

## 2011-01-03 MED ORDER — INSULIN ASPART 100 UNIT/ML ~~LOC~~ SOLN
0.0000 [IU] | Freq: Three times a day (TID) | SUBCUTANEOUS | Status: DC
  Administered 2011-01-03 – 2011-01-04 (×2): 3 [IU] via SUBCUTANEOUS
  Administered 2011-01-04: 1 [IU] via SUBCUTANEOUS
  Administered 2011-01-04 – 2011-01-05 (×2): 3 [IU] via SUBCUTANEOUS
  Administered 2011-01-05: 1 [IU] via SUBCUTANEOUS
  Administered 2011-01-05: 2 [IU] via SUBCUTANEOUS
  Administered 2011-01-06: 1 [IU] via SUBCUTANEOUS
  Filled 2011-01-03: qty 3

## 2011-01-03 NOTE — Progress Notes (Signed)
Physical Therapy Evaluation Patient Details Name: Jeffrey Melendez MRN: [id number] DOB: [birth year] Today's Date: 01/03/2011 Time: 4098-1191  Tier II Eval Problem List:  Patient Active Problem List  Diagnoses  . Systolic CHF, acute on chronic  . COPD (chronic obstructive pulmonary disease)  . CAD (coronary artery disease)  . Hyperlipidemia  . Tobacco abuse  . HTN (hypertension)  . Ischemic cardiomyopathy  . Peripheral vascular disease  . Cerebrovascular disease  . Hyperglycemia    Past Medical History:  Past Medical History  Diagnosis Date  . COPD (chronic obstructive pulmonary disease)   . Myocardial infarction   . Stroke   . PAD (peripheral artery disease)    Past Surgical History:  Past Surgical History  Procedure Date  . Coronary stent placement   . Arm surgery   . Foot surgery   . Head surgery   . Facial fracture surgery   . Knee surgery     PT Assessment/Plan/Recommendation PT Assessment Clinical Impression Statement: Pt presents with a medical diagnosis of COPD exacerbation. Pt will benefit from skilled PT in the acute care setting to improve activity tolerance, gait, and balance in preparation for DC home. PT Recommendation/Assessment: Patient will need skilled PT in the acute care venue PT Problem List: Decreased activity tolerance;Decreased balance;Decreased knowledge of use of DME;Cardiopulmonary status limiting activity PT Therapy Diagnosis : Difficulty walking PT Plan PT Frequency: Min 3X/week PT Treatment/Interventions: DME instruction;Gait training;Functional mobility training;Balance training;Patient/family education PT Recommendation Recommendations for Other Services: OT consult Follow Up Recommendations: Home health PT;24 hour supervision/assistance for safety Equipment Recommended: Rolling walker with 5" wheels. Pt is NOT safe with straight cane. PT Goals  Acute Rehab PT Goals PT Goal Formulation: With patient Time For Goal Achievement: 7 days Pt  will Transfer Sit to Stand/Stand to Sit: with supervision Pt will Transfer Bed to Chair/Chair to Bed: with supervision Pt will Ambulate: 51 - 150 feet;with supervision;with rolling walker Pt will Go Up / Down Stairs: 1-2 stairs;with rolling walker;with min assist  PT Evaluation Precautions/Restrictions  Precautions Precautions: Fall Prior Functioning  Home Living Lives With: Significant other Receives Help From:  (significant other) Type of Home: Apartment Home Layout: One level Home Access:  (1 curb step) Prior Function Level of Independence: Requires assistive device for independence (however pt reports frequent falls with use of cane) Cognition Cognition Arousal/Alertness: Awake/alert Overall Cognitive Status: Appears within functional limits for tasks assessed Sensation/Coordination   Extremity Assessment RLE Assessment RLE Assessment: Exceptions to [facility] RLE Strength RLE Overall Strength Comments: Strength > or = 4/5. LLE Assessment LLE Assessment: Exceptions to [facility] LLE Strength LLE Overall Strength Comments: Strength > or = 4/5 Mobility (including Balance) Bed Mobility Bed Mobility: Yes Supine to Sit: 6: Modified independent (Device/Increase time) Sit to Supine - Right: 6: Modified independent (Device/Increase time) Transfers Transfers: Yes Sit to Stand: 4: Min assist Sit to Stand Details (indicate cue type and reason): VCs safety, technique. Pt very unsteady with near fall on 1st attempt to stand. Stand to Sit:  (Min-guard.) Stand Pivot Transfers: 4: Min assist Stand Pivot Transfer Details (indicate cue type and reason): VCs safety, technique. Assist to steady and negotiate with RW.  Ambulation/Gait Ambulation/Gait: Yes Ambulation/Gait Assistance: 4: Min assist Ambulation/Gait Assistance Details (indicate cue type and reason):  (2-3 instances of LOB requiring assist, espcially with turns.) Ambulation Distance (Feet): 40 Feet (x2. Seated and standing rest breaks  needed.) O2 sats: 90% on RA at rest, 74% on RA during ambulation/86% on 2L O2 during ambulation, 99%  on 2L O2 at rest  Assistive device: Straight cane Gait Pattern: Decreased stride length;Decreased step length - left;Decreased step length - right;Decreased weight shift to right;Decreased weight shift to left  Balance Balance Assessed:  (Multiple instances of LOB. Poor standing balance.) Exercise    End of Session PT - End of Session Equipment Utilized During Treatment: Gait belt Activity Tolerance: Other (comment) (Very limited activity tolerance with and without O2.) Patient left: in bed;with call bell in reach;with family/visitor present Nurse Communication:  (Notified RN of O2 sats with activity) General Behavior During Session: [facility] for tasks performed Cognition: [facility] for tasks performed  Rebeca Alert [facility] 01/03/2011, 4:09 PM

## 2011-01-03 NOTE — Progress Notes (Signed)
Dr. Darnelle Catalan notified that patient went into AFIB Jeffrey Melendez

## 2011-01-03 NOTE — Progress Notes (Addendum)
Subjective: Jeffrey Melendez says that his breathing is remarkably better today. He is somewhat anxious and reports that he takes Vistaril and Xanax regularly and that he has not been getting these medications here. He does have the Xanax ordered but it is as needed and he has not been requesting it. The Vistaril was not ordered on admission. Patient denies any chest pain. He states his appetite is good. Overall, he feels that his improvement is remarkable and that he will likely be ready for discharge tomorrow.  Objective: Vital signs in last 24 hours: Temp:  [97.4 F (36.3 C)-98 F (36.7 C)] 98 F (36.7 C) (11/09 0513) Pulse Rate:  [64-69] 64  (11/09 0513) Resp:  [18] 18  (11/09 0513) BP: (125-134)/(68-82) 134/82 mmHg (11/09 0513) SpO2:  [95 %-97 %] 97 % (11/09 0847) Weight change:  Last BM Date: 01/03/11  Intake/Output from previous day: 11/08 0701 - 11/09 0700 In: 1640 [P.O.:1640] Out: 3600 [Urine:3600] Total I/O In: 240 [P.O.:240] Out: 400 [Urine:400]   Physical Exam: General Appearance:    Alert, cooperative, no distress, appears stated age  Lungs:     Diminished to auscultation bilaterally, respirations unlabored, no crackles or wheezes.  Better air movement today.    Heart:    Regular rate and rhythm, S1 and S2 normal, no murmur, rub   or gallop  Abdomen:     Soft, non-tender, bowel sounds active all four quadrants,    no masses, no organomegaly  Extremities:   Extremities normal, atraumatic, no cyanosis or edema  Pulses:   1+ and symmetric all extremities     Lab Results: Results for orders placed during the hospital encounter of 01/01/11 (from the past 48 hour(s))  PRO B NATRIURETIC PEPTIDE     Status: Abnormal   Collection Time   01/01/11  3:11 PM      Component Value Range Comment   BNP, POC 816.5 (*) 0 - 125 (pg/mL)   CBC     Status: Abnormal   Collection Time   01/01/11  3:11 PM      Component Value Range Comment   WBC 7.7  4.0 - 10.5 (K/uL)    RBC 3.64 (*) 4.22  - 5.81 (MIL/uL)    Hemoglobin 12.6 (*) 13.0 - 17.0 (g/dL)    HCT 16.1 (*) 09.6 - 52.0 (%)    MCV 95.6  78.0 - 100.0 (fL)    MCH 34.6 (*) 26.0 - 34.0 (pg)    MCHC 36.2 (*) 30.0 - 36.0 (g/dL)    RDW 04.5  40.9 - 81.1 (%)    Platelets 186  150 - 400 (K/uL)   COMPREHENSIVE METABOLIC PANEL     Status: Abnormal   Collection Time   01/01/11  3:11 PM      Component Value Range Comment   Sodium 136  135 - 145 (mEq/L)    Potassium 3.5  3.5 - 5.1 (mEq/L)    Chloride 102  96 - 112 (mEq/L)    CO2 22  19 - 32 (mEq/L)    Glucose, Bld 201 (*) 70 - 99 (mg/dL)    BUN 8  6 - 23 (mg/dL)    Creatinine, Ser 9.14  0.50 - 1.35 (mg/dL)    Calcium 9.2  8.4 - 10.5 (mg/dL)    Total Protein 7.0  6.0 - 8.3 (g/dL)    Albumin 3.5  3.5 - 5.2 (g/dL)    AST 21  0 - 37 (U/L)    ALT 23  0 -  53 (U/L)    Alkaline Phosphatase 60  39 - 117 (U/L)    Total Bilirubin 0.2 (*) 0.3 - 1.2 (mg/dL)    GFR calc non Af Amer >90  >90 (mL/min)    GFR calc Af Amer >90  >90 (mL/min)   TROPONIN I     Status: Normal   Collection Time   01/01/11  3:11 PM      Component Value Range Comment   Troponin I <0.30  <0.30 (ng/mL)   D-DIMER, QUANTITATIVE     Status: Abnormal   Collection Time   01/01/11  3:11 PM      Component Value Range Comment   D-Dimer, Quant 2.99 (*) 0.00 - 0.48 (ug/mL-FEU)   MAGNESIUM     Status: Abnormal   Collection Time   01/02/11  4:45 AM      Component Value Range Comment   Magnesium 1.4 (*) 1.5 - 2.5 (mg/dL)   CBC     Status: Abnormal   Collection Time   01/02/11  4:45 AM      Component Value Range Comment   WBC 11.7 (*) 4.0 - 10.5 (K/uL)    RBC 3.69 (*) 4.22 - 5.81 (MIL/uL)    Hemoglobin 12.6 (*) 13.0 - 17.0 (g/dL)    HCT 16.1 (*) 09.6 - 52.0 (%)    MCV 97.3  78.0 - 100.0 (fL)    MCH 34.1 (*) 26.0 - 34.0 (pg)    MCHC 35.1  30.0 - 36.0 (g/dL)    RDW 04.5  40.9 - 81.1 (%)    Platelets 191  150 - 400 (K/uL)   DIFFERENTIAL     Status: Abnormal   Collection Time   01/02/11  4:45 AM      Component Value  Range Comment   Neutrophils Relative 89 (*) 43 - 77 (%)    Neutro Abs 10.4 (*) 1.7 - 7.7 (K/uL)    Lymphocytes Relative 8 (*) 12 - 46 (%)    Lymphs Abs 0.9  0.7 - 4.0 (K/uL)    Monocytes Relative 3  3 - 12 (%)    Monocytes Absolute 0.4  0.1 - 1.0 (K/uL)    Eosinophils Relative 0  0 - 5 (%)    Eosinophils Absolute 0.0  0.0 - 0.7 (K/uL)    Basophils Relative 0  0 - 1 (%)    Basophils Absolute 0.0  0.0 - 0.1 (K/uL)   COMPREHENSIVE METABOLIC PANEL     Status: Abnormal   Collection Time   01/02/11  4:45 AM      Component Value Range Comment   Sodium 137  135 - 145 (mEq/L)    Potassium 3.9  3.5 - 5.1 (mEq/L)    Chloride 100  96 - 112 (mEq/L)    CO2 23  19 - 32 (mEq/L)    Glucose, Bld 223 (*) 70 - 99 (mg/dL)    BUN 11  6 - 23 (mg/dL)    Creatinine, Ser 9.14  0.50 - 1.35 (mg/dL)    Calcium 9.8  8.4 - 10.5 (mg/dL)    Total Protein 7.0  6.0 - 8.3 (g/dL)    Albumin 3.4 (*) 3.5 - 5.2 (g/dL)    AST 16  0 - 37 (U/L)    ALT 21  0 - 53 (U/L)    Alkaline Phosphatase 58  39 - 117 (U/L)    Total Bilirubin 0.1 (*) 0.3 - 1.2 (mg/dL)    GFR calc non Af Amer >90  >90 (  mL/min)    GFR calc Af Amer >90  >90 (mL/min)   CARDIAC PANEL(CRET KIN+CKTOT+MB+TROPI)     Status: Abnormal   Collection Time   01/02/11  4:45 AM      Component Value Range Comment   Total CK 120  7 - 232 (U/L)    CK, MB 3.5  0.3 - 4.0 (ng/mL)    Troponin I <0.30  <0.30 (ng/mL)    Relative Index 2.9 (*) 0.0 - 2.5    PRO B NATRIURETIC PEPTIDE     Status: Abnormal   Collection Time   01/02/11  4:45 AM      Component Value Range Comment   BNP, POC 1046.0 (*) 0 - 125 (pg/mL)   PRO B NATRIURETIC PEPTIDE     Status: Abnormal   Collection Time   01/03/11  4:40 AM      Component Value Range Comment   BNP, POC 628.8 (*) 0 - 125 (pg/mL)   BASIC METABOLIC PANEL     Status: Abnormal   Collection Time   01/03/11  4:40 AM      Component Value Range Comment   Sodium 136  135 - 145 (mEq/L)    Potassium 3.6  3.5 - 5.1 (mEq/L)    Chloride 99   96 - 112 (mEq/L)    CO2 26  19 - 32 (mEq/L)    Glucose, Bld 167 (*) 70 - 99 (mg/dL)    BUN 16  6 - 23 (mg/dL)    Creatinine, Ser 8.65  0.50 - 1.35 (mg/dL)    Calcium 9.4  8.4 - 10.5 (mg/dL)    GFR calc non Af Amer 88 (*) >90 (mL/min)    GFR calc Af Amer >90  >90 (mL/min)   CBC     Status: Abnormal   Collection Time   01/03/11  4:40 AM      Component Value Range Comment   WBC 12.9 (*) 4.0 - 10.5 (K/uL)    RBC 3.92 (*) 4.22 - 5.81 (MIL/uL)    Hemoglobin 13.3  13.0 - 17.0 (g/dL)    HCT 78.4 (*) 69.6 - 52.0 (%)    MCV 98.7  78.0 - 100.0 (fL)    MCH 33.9  26.0 - 34.0 (pg)    MCHC 34.4  30.0 - 36.0 (g/dL)    RDW 29.5  28.4 - 13.2 (%)    Platelets 197  150 - 400 (K/uL)    Studies/Results: Ct Angio Chest W/cm &/or Wo Cm  01/01/2011  *RADIOLOGY REPORT*  Clinical Data:  Shortness of breath x1 week, cough, COPD, evaluate for PE  CT ANGIOGRAPHY CHEST WITH CONTRAST  Technique:  Multidetector CT imaging of the chest was performed using the standard protocol during bolus administration of intravenous contrast.  Multiplanar CT image reconstructions including MIPs were obtained to evaluate the vascular anatomy.  Contrast: 80mL OMNIPAQUE IOHEXOL 350 MG/ML IV SOLN  Comparison:  Chest radiograph dated 01/01/2011.  [facility] CT chest dated 06/06/2009.  Findings:  No evidence of pulmonary embolism.  Moderate paraseptal/centrilobular emphysematous changes.  No superimposed opacities suspicious for pneumonia or interstitial edema.  No suspicious pulmonary nodules.  Small bilateral pleural effusions with associated lower lobe atelectasis.  No pneumothorax.  Visualized thyroid is unremarkable.  The heart is top normal in size.  No pericardial effusion. Coronary atherosclerosis.  Atherosclerotic calcifications of the aortic arch.  Visualized upper abdomen is unremarkable.  Visualized osseous structures are within normal limits.  Review of the MIP images confirms the  above findings.  IMPRESSION: No evidence of pulmonary  embolism.  Moderate paraseptal/centrilobular emphysematous changes.  Small bilateral pleural effusions with associated lower lobe atelectasis.  Original Report Authenticated By: Charline Bills, M.D.   Dg Chest Portable 1 View  01/03/2011  *RADIOLOGY REPORT*  Clinical Data: Evaluate congestive heart failure  PORTABLE CHEST - 1 VIEW  Comparison: 01/01/2011; 06/03/2009; chest CT - 01/01/2011  Findings: Grossly unchanged cardiac silhouette and mediastinal contours. Grossly unchanged increased conspicuity of the pulmonary interstitium compatible with known extensive underlying centrilobular emphysematous changes as demonstrated on recent chest CT.  Grossly unchanged bibasilar heterogeneous opacities.  No definite pleural effusion or pneumothorax.  Grossly unchanged bones.  IMPRESSION: Marked centrilobular emphysematous change without definite superimposed acute cardiopulmonary process.  Original Report Authenticated By: Waynard Reeds, M.D.   Dg Chest Portable 1 View  01/01/2011  *RADIOLOGY REPORT*  Clinical Data: Shortness of breath  PORTABLE CHEST - 1 VIEW  Comparison: 06/03/2009  Findings: Mild diffuse interstitial edema or infiltrates bilaterally.  No definite effusion.  Mild cardiomegaly. Atheromatous aortic arch.  Regional bones unremarkable.  IMPRESSION:  1.  Mild cardiomegaly with new diffuse interstitial edema or infiltrates.  Original Report Authenticated By: Osa Craver, M.D.    Medications: Scheduled Meds:    . albuterol  2 puff Inhalation QID  . albuterol  5 mg Nebulization Once  . albuterol  5 mg Nebulization Once  . amLODipine  5 mg Oral Daily  . aspirin  325 mg Oral Daily  . cholecalciferol  1,000 Units Oral Daily  . citalopram  20 mg Oral Daily  . dipyridamole-aspirin  1 capsule Oral BID  . enoxaparin  40 mg Subcutaneous Q24H  . fluticasone  1 puff Inhalation BID  . furosemide  40 mg Intravenous Q12H  . lisinopril  10 mg Oral Daily  . metoprolol  100 mg Oral Daily    . multivitamins ther. w/minerals  1 tablet Oral Daily  . omega-3 acid ethyl esters  1 g Oral Daily  . pantoprazole  40 mg Oral Q1200  . polyvinyl alcohol  1 drop Both Eyes QID  . rosuvastatin  20 mg Oral Daily   Continuous Infusions:  PRN Meds:.ALPRAZolam, hydrOXYzine, nitroGLYCERIN, traZODone  Assessment/Plan:  Principal Problem:  *Systolic CHF, acute on chronic: The patient diuresed 1.5 L yesterday and continues on Lasix twice a day. His symptoms have dramatically improved.  A two-dimensional echocardiogram was done which does confirm systolic dysfunction with an ejection fraction of 35-40%. Regarding core measures, he is on an ACE inhibitor. He has a long-standing history of ischemic cardiomyopathy but his cardiac enzymes have been negative and he continues to deny any chest pain. At this time, would continue diuretic therapy and continue to monitor him closely.  His chest x-ray today shows emphysematous changes but no frank edema. His pro BNP level is diminishing. His creatinine is stable with diuresis.   Will get physical therapy to evaluate him today, and plan on sending him home tomorrow if he remains stable. Active Problems:   COPD (chronic obstructive pulmonary disease): No evidence of acute exacerbation. Continue bronchodilator therapy. No indication for steroids at present.  Continue Advair, which he reports, is improving his symptoms..  Ischemic cardiomyopathy: The patient has seen Dr. Donnie Aho for this in the past. His cardiac markers are negative to date. We have checked his cardiac markers for a full set of 3, and these were negative .  He remains on aspirin therapy.  CAD (coronary artery disease): Cardiac  markers are negative x 3. Continue aspirin therapy.  Tobacco abuse: The patient reports that he quit smoking approximately 10 months ago.  HTN (hypertension): Continue Norvasc, metoprolol, and lisinopril.  Cerebrovascular disease: Continue Aggrenox, and risk factor reduction.   Hyperlipidemia: Continue fish oil and statin therapy.    LOS: 2 days   RAMA,CHRISTINA 01/03/2011, 10:59 AM   01/02/2009 5:52 PM Addendum: Called by RN due to patient going into atrial fibrillation with a heart rate of 105 beats per minute. Patient is asymptomatic at this time. Will order a 12-lead EKG, cycle cardiac markers q. 8 hours x3 sets, and prescribed metoprolol when necessary for sustained heart rate greater than 110 beats per minute.

## 2011-01-04 ENCOUNTER — Other Ambulatory Visit: Payer: Self-pay

## 2011-01-04 DIAGNOSIS — N179 Acute kidney failure, unspecified: Secondary | ICD-10-CM | POA: Diagnosis not present

## 2011-01-04 LAB — GLUCOSE, CAPILLARY
Glucose-Capillary: 138 mg/dL — ABNORMAL HIGH (ref 70–99)
Glucose-Capillary: 210 mg/dL — ABNORMAL HIGH (ref 70–99)

## 2011-01-04 LAB — BASIC METABOLIC PANEL
BUN: 23 mg/dL (ref 6–23)
Calcium: 9.2 mg/dL (ref 8.4–10.5)
GFR calc Af Amer: 41 mL/min — ABNORMAL LOW (ref >90)
GFR calc non Af Amer: 35 mL/min — ABNORMAL LOW (ref >90)
Glucose, Bld: 154 mg/dL — ABNORMAL HIGH (ref 70–99)
Sodium: 131 mEq/L — ABNORMAL LOW (ref 135–145)

## 2011-01-04 LAB — CARDIAC PANEL(CRET KIN+CKTOT+MB+TROPI)
CK, MB: 2.1 ng/mL (ref 0.3–4.0)
Relative Index: INVALID (ref 0.0–2.5)
Total CK: 71 U/L (ref 7–232)
Total CK: 81 U/L (ref 7–232)

## 2011-01-04 MED ORDER — LEVALBUTEROL TARTRATE 45 MCG/ACT IN AERO
2.0000 | INHALATION_SPRAY | Freq: Four times a day (QID) | RESPIRATORY_TRACT | Status: DC | PRN
  Filled 2011-01-04: qty 15

## 2011-01-04 MED ORDER — NITROGLYCERIN 0.4 MG/HR TD PT24
0.4000 mg | MEDICATED_PATCH | Freq: Every day | TRANSDERMAL | Status: DC
  Administered 2011-01-04: 0.4 mg via TRANSDERMAL
  Filled 2011-01-04 (×2): qty 1

## 2011-01-04 MED ORDER — LEVALBUTEROL HCL 0.63 MG/3ML IN NEBU
0.6300 mg | INHALATION_SOLUTION | Freq: Four times a day (QID) | RESPIRATORY_TRACT | Status: DC | PRN
  Administered 2011-01-05: 0.63 mg via RESPIRATORY_TRACT
  Filled 2011-01-04: qty 3

## 2011-01-04 NOTE — Progress Notes (Signed)
Subjective:  I received a call from the nurses last night because the patient went into atrial fibrillation with rapid ventricular response. Patient states that he has had problems with this in the past but that he has never been on Coumadin. He continues to report that his breathing has improved. Denies any chest pain though he does have a chronically tight chest. He states that he stays on a nitroglycerin patch but that it was not ordered on admission.  Objective: Vital signs in last 24 hours: Temp:  [97.6 F (36.4 C)-98.2 F (36.8 C)] 97.8 F (36.6 C) (11/10 0451) Pulse Rate:  [69-124] 69  (11/10 0451) Resp:  [18-20] 18  (11/10 0451) BP: (52-127)/(33-74) 109/62 mmHg (11/10 0451) SpO2:  [92 %-98 %] 94 % (11/10 0906) Weight change:  Last BM Date: 01/03/11 (loose)  Intake/Output from previous day: 11/09 0701 - 11/10 0700 In: 1580 [P.O.:1080; IV Piggyback:500] Out: 851 [Urine:850; Stool:1]    CBG (last 3)   Basename 01/04/11 0728 01/03/11 2111 01/03/11 1834  GLUCAP 138* 175* 221*     Physical Exam: General Appearance:    Alert, cooperative, no distress, appears stated age  Lungs:     Diminished to auscultation bilaterally, respirations unlabored, no crackles or wheezes.   Heart:    Currently regular rate and rhythm. S1 and S2 normal, no murmur, rub or gallop  Abdomen:     Soft, non-tender, bowel sounds active all four quadrants,    no masses, no organomegaly  Extremities:   Extremities normal, atraumatic, no cyanosis or edema  Pulses:   1+ and symmetric all extremities     Lab Results: Results for orders placed during the hospital encounter of 01/01/11 (from the past 48 hour(s))  PRO B NATRIURETIC PEPTIDE     Status: Abnormal   Collection Time   01/03/11  4:40 AM      Component Value Range Comment   BNP, POC 628.8 (*) 0 - 125 (pg/mL)   BASIC METABOLIC PANEL     Status: Abnormal   Collection Time   01/03/11  4:40 AM      Component Value Range Comment   Sodium 136  135  - 145 (mEq/L)    Potassium 3.6  3.5 - 5.1 (mEq/L)    Chloride 99  96 - 112 (mEq/L)    CO2 26  19 - 32 (mEq/L)    Glucose, Bld 167 (*) 70 - 99 (mg/dL)    BUN 16  6 - 23 (mg/dL)    Creatinine, Ser 4.09  0.50 - 1.35 (mg/dL)    Calcium 9.4  8.4 - 10.5 (mg/dL)    GFR calc non Af Amer 88 (*) >90 (mL/min)    GFR calc Af Amer >90  >90 (mL/min)   CBC     Status: Abnormal   Collection Time   01/03/11  4:40 AM      Component Value Range Comment   WBC 12.9 (*) 4.0 - 10.5 (K/uL)    RBC 3.92 (*) 4.22 - 5.81 (MIL/uL)    Hemoglobin 13.3  13.0 - 17.0 (g/dL)    HCT 81.1 (*) 91.4 - 52.0 (%)    MCV 98.7  78.0 - 100.0 (fL)    MCH 33.9  26.0 - 34.0 (pg)    MCHC 34.4  30.0 - 36.0 (g/dL)    RDW 78.2  95.6 - 21.3 (%)    Platelets 197  150 - 400 (K/uL)   HEMOGLOBIN A1C     Status: Abnormal  Collection Time   01/03/11  4:40 AM      Component Value Range Comment   Hemoglobin A1C 6.3 (*) <5.7 (%)    Mean Plasma Glucose 134 (*) <117 (mg/dL)   GLUCOSE, CAPILLARY     Status: Abnormal   Collection Time   01/03/11  5:01 PM      Component Value Range Comment   Glucose-Capillary 220 (*) 70 - 99 (mg/dL)   GLUCOSE, CAPILLARY     Status: Abnormal   Collection Time   01/03/11  6:34 PM      Component Value Range Comment   Glucose-Capillary 221 (*) 70 - 99 (mg/dL)   CARDIAC PANEL(CRET KIN+CKTOT+MB+TROPI)     Status: Normal   Collection Time   01/03/11  6:50 PM      Component Value Range Comment   Total CK 163  7 - 232 (U/L)    CK, MB 2.4  0.3 - 4.0 (ng/mL)    Troponin I <0.30  <0.30 (ng/mL)    Relative Index 1.5  0.0 - 2.5    GLUCOSE, CAPILLARY     Status: Abnormal   Collection Time   01/03/11  9:11 PM      Component Value Range Comment   Glucose-Capillary 175 (*) 70 - 99 (mg/dL)    Comment 1 Documented in Chart      Comment 2 Notify RN     BASIC METABOLIC PANEL     Status: Abnormal   Collection Time   01/04/11 12:25 AM      Component Value Range Comment   Sodium 131 (*) 135 - 145 (mEq/L)    Potassium  3.8  3.5 - 5.1 (mEq/L)    Chloride 94 (*) 96 - 112 (mEq/L)    CO2 26  19 - 32 (mEq/L)    Glucose, Bld 154 (*) 70 - 99 (mg/dL)    BUN 23  6 - 23 (mg/dL)    Creatinine, Ser 1.19 (*) 0.50 - 1.35 (mg/dL)    Calcium 9.2  8.4 - 10.5 (mg/dL)    GFR calc non Af Amer 35 (*) >90 (mL/min)    GFR calc Af Amer 41 (*) >90 (mL/min)   CARDIAC PANEL(CRET KIN+CKTOT+MB+TROPI)     Status: Normal   Collection Time   01/04/11 12:25 AM      Component Value Range Comment   Total CK 81  7 - 232 (U/L)    CK, MB 2.1  0.3 - 4.0 (ng/mL)    Troponin I <0.30  <0.30 (ng/mL)    Relative Index RELATIVE INDEX IS INVALID  0.0 - 2.5    GLUCOSE, CAPILLARY     Status: Abnormal   Collection Time   01/04/11  7:28 AM      Component Value Range Comment   Glucose-Capillary 138 (*) 70 - 99 (mg/dL)    Comment 1 Notify RN      Comment 2 Documented in Chart     CARDIAC PANEL(CRET KIN+CKTOT+MB+TROPI)     Status: Normal   Collection Time   01/04/11  7:30 AM      Component Value Range Comment   Total CK 71  7 - 232 (U/L)    CK, MB 2.4  0.3 - 4.0 (ng/mL)    Troponin I <0.30  <0.30 (ng/mL)    Relative Index RELATIVE INDEX IS INVALID  0.0 - 2.5     Studies/Results:  Dg Chest Portable 1 View  01/03/2011  *RADIOLOGY REPORT*  Clinical Data: Evaluate congestive heart failure  PORTABLE CHEST - 1 VIEW  Comparison: 01/01/2011; 06/03/2009; chest CT - 01/01/2011  Findings: Grossly unchanged cardiac silhouette and mediastinal contours. Grossly unchanged increased conspicuity of the pulmonary interstitium compatible with known extensive underlying centrilobular emphysematous changes as demonstrated on recent chest CT.  Grossly unchanged bibasilar heterogeneous opacities.  No definite pleural effusion or pneumothorax.  Grossly unchanged bones.  IMPRESSION: Marked centrilobular emphysematous change without definite superimposed acute cardiopulmonary process.  Original Report Authenticated By: Waynard Reeds, M.D.    Medications: Scheduled  Meds:    . albuterol  2 puff Inhalation QID  . albuterol  5 mg Nebulization Once  . albuterol  5 mg Nebulization Once  . amLODipine  5 mg Oral Daily  . aspirin  325 mg Oral Daily  . cholecalciferol  1,000 Units Oral Daily  . citalopram  20 mg Oral Daily  . dipyridamole-aspirin  1 capsule Oral BID  . enoxaparin  40 mg Subcutaneous Q24H  . fluticasone  1 puff Inhalation BID  . furosemide  40 mg Intravenous Q12H  . insulin aspart  0-5 Units Subcutaneous QHS  . insulin aspart  0-9 Units Subcutaneous TID WC  . lisinopril  10 mg Oral Daily  . metoprolol  100 mg Oral Daily  . multivitamins ther. w/minerals  1 tablet Oral Daily  . omega-3 acid ethyl esters  1 g Oral Daily  . pantoprazole  40 mg Oral Q1200  . polyvinyl alcohol  1 drop Both Eyes QID  . rosuvastatin  20 mg Oral Daily  . sodium chloride  500 mL Intravenous Once   Continuous Infusions:  PRN Meds:.acetaminophen, ALPRAZolam, HYDROcodone-acetaminophen, hydrOXYzine, metoprolol, nitroGLYCERIN, traZODone  Assessment/Plan:  Principal Problem:  *Systolic CHF, acute on chronic: The patient's I and O. balance is neutral today. His creatinine has doubled. At this point, we will need to hold his Lasix and hold his ACE inhibitor until his renal function improves. A two-dimensional echocardiogram was done on 01/02/11 which did confirm systolic dysfunction with an ejection fraction of 35-40%. Regarding core measures, he is on an ACE inhibitor, but as mentioned, we will need to hold this until his renal function is back to baseline. He has a long-standing history of ischemic cardiomyopathy but his cardiac enzymes have been negative and he continues to deny any chest pain. He does have some chronic chest tightness so would resume his nitroglycerin patch. With his rise in creatinine and his new onset atrial fibrillation, would hold his discharge. Active Problems:   COPD (chronic obstructive pulmonary disease): No evidence of acute exacerbation.  Continue bronchodilator therapy, but switch him from albuterol to Xopenex given his recent atrial fibrillation. No indication for steroids at present.  Continue Advair, which he reports, is improving his symptoms..  Ischemic cardiomyopathy: The patient has seen Dr. Donnie Aho for this in the past. His cardiac markers were negative. We we cycled his enzymes a second time last night after he went into atrial fibrillation and these sets were negative as well.  He remains on aspirin therapy.  CAD (coronary artery disease): Cardiac markers are negative x 6. Continue aspirin therapy.  Tobacco abuse: The patient reports that he quit smoking approximately 10 months ago.  HTN (hypertension): Continue Norvasc and metoprolol. Hold lisinopril due to his worsening renal function.  Cerebrovascular disease: Continue Aggrenox, and risk factor reduction.  Hyperlipidemia: Continue fish oil and statin therapy.  Atrial Fibrillation with rapid ventricular response: The patient went into atrial fibrillation with rapid ventricular response last night. 12-lead EKG was done  which confirmed this. He is now back in normal sinus rhythm. We'll repeat a 12-lead EKG. His cardiac markers were negative. Patient does report that he has a history of this in the past and has not taken Coumadin for this.  Acute renal failure: Likely secondary to diuretic therapy. We'll hold Lasix as well as his ACE inhibitor and recheck his renal function in the morning.   LOS: 3 days   Akeira Lahm 01/04/2011, 10:24 AM

## 2011-01-04 NOTE — Progress Notes (Signed)
Pt on 2-3L o2 and continous pulsox. Pt need home 02. Awaiting MD orders

## 2011-01-04 NOTE — Plan of Care (Signed)
Problem: Phase II Progression Outcomes Goal: O2 sats > equal to 90% on RA or at baseline Outcome: Progressing O2 at 2l/min via Jeffrey Melendez with sats at 95%

## 2011-01-04 NOTE — Progress Notes (Signed)
CM spoke with pt concerning d/c palnning. Per pt PCP in [place] at [place]. Cm to contact [facility] upon [facility] for community resources. Per pt choice [facility] to provide DME. Per Pt choice upon MD order for HHPT, [facility] to provide services. [facility] notified of referral.

## 2011-01-05 DIAGNOSIS — R42 Dizziness and giddiness: Secondary | ICD-10-CM | POA: Diagnosis not present

## 2011-01-05 LAB — GLUCOSE, CAPILLARY
Glucose-Capillary: 200 mg/dL — ABNORMAL HIGH (ref 70–99)
Glucose-Capillary: 186 mg/dL — ABNORMAL HIGH (ref 70–99)
Glucose-Capillary: 233 mg/dL — ABNORMAL HIGH (ref 70–99)

## 2011-01-05 LAB — BASIC METABOLIC PANEL
BUN: 14 mg/dL (ref 6–23)
Calcium: 9.3 mg/dL (ref 8.4–10.5)
Creatinine, Ser: 1 mg/dL (ref 0.50–1.35)
GFR calc non Af Amer: 78 mL/min — ABNORMAL LOW (ref >90)
Glucose, Bld: 230 mg/dL — ABNORMAL HIGH (ref 70–99)

## 2011-01-05 LAB — CBC
HCT: 34.8 % — ABNORMAL LOW (ref 39.0–52.0)
MCV: 98 fL (ref 78.0–100.0)

## 2011-01-05 NOTE — Progress Notes (Signed)
Interval History: Jeffrey Melendez 65 year old male who was admitted on 01/02/2011 with dyspnea secondary to decompensated systolic congestive heart failure. His symptoms have improved with appropriate diuresis. He did develop worsening kidney function yesterday which prompted [place] to discontinue his ACE inhibitor and his Lasix. His kidney function has normalized with this intervention. Initially was to send him home today however he is complaining of new onset dizziness. He was started back on his nitroglycerin patch yesterday which may be contributory. ROS: Jeffrey Melendez and up in a chair. He states that he is dizzy and feels like he will pass out if he stands up. He denies any chest pain. He continues to report that his breathing is better than it was when he came in and he denies cough.   Objective: Vital signs in last 24 hours: Temp:  [96.4 F (35.8 C)-98 F (36.7 C)] 97.4 F (36.3 C) (11/11 0449) Pulse Rate:  [63-73] 71  (11/11 0449) Resp:  [18] 18  (11/11 0449) BP: (93-111)/(54-68) 108/68 mmHg (11/11 0449) SpO2:  [92 %-99 %] 99 % (11/11 0903) Weight:  [75.1 kg (165 lb 9.1 oz)] 165 lb 9.1 oz (75.1 kg) (11/11 0449) Weight change:  Last BM Date: 01/03/11  CBG (last 3)   Basename 01/05/11 0736 01/04/11 1649 01/04/11 1218  GLUCAP 186* 206* 210*     Intake/Output from previous day: 11/10 0701 - 11/11 0700 In: 900 [P.O.:900] Out: 2250 [Urine:2250]     Physical Exam: Gen: Up in the chair. Alert. Cardiovascular: Regular rate, and rhythm. No murmurs, rubs, or gallops. Respiratory: Diminished breath sounds bilaterally. Gastrointestinal:, Nontender, nondistended with normal active bowel sounds. Extremities: No clubbing, edema, or cyanosis.   Lab Results: Results for orders placed during the hospital encounter of 01/01/11 (from the past 24 hour(s))  GLUCOSE, CAPILLARY     Status: Abnormal   Collection Time   01/04/11 12:18 PM      Component Value Range   Glucose-Capillary 210 (*) 70 - 99  (mg/dL)   Comment 1 Notify RN     Comment 2 Documented in Chart    GLUCOSE, CAPILLARY     Status: Abnormal   Collection Time   01/04/11  4:49 PM      Component Value Range   Glucose-Capillary 206 (*) 70 - 99 (mg/dL)  BASIC METABOLIC PANEL     Status: Abnormal   Collection Time   01/05/11  5:40 AM      Component Value Range   Sodium 132 (*) 135 - 145 (mEq/L)   Potassium 4.1  3.5 - 5.1 (mEq/L)   Chloride 97  96 - 112 (mEq/L)   CO2 26  19 - 32 (mEq/L)   Glucose, Bld 230 (*) 70 - 99 (mg/dL)   BUN 14  6 - 23 (mg/dL)   Creatinine, Ser 7.82  0.50 - 1.35 (mg/dL)   Calcium 9.3  8.4 - 95.6 (mg/dL)   GFR calc non Af Amer 78 (*) >90 (mL/min)   GFR calc Af Amer >90  >90 (mL/min)  CBC     Status: Abnormal   Collection Time   01/05/11  5:40 AM      Component Value Range   WBC 10.9 (*) 4.0 - 10.5 (K/uL)   RBC 3.55 (*) 4.22 - 5.81 (MIL/uL)   Hemoglobin 12.0 (*) 13.0 - 17.0 (g/dL)   HCT 21.3 (*) 08.6 - 52.0 (%)   MCV 98.0  78.0 - 100.0 (fL)   MCH 33.8  26.0 - 34.0 (pg)   MCHC  34.5  30.0 - 36.0 (g/dL)   RDW 16.1  09.6 - 04.5 (%)   Platelets 172  150 - 400 (K/uL)  GLUCOSE, CAPILLARY     Status: Abnormal   Collection Time   01/05/11  7:36 AM      Component Value Range   Glucose-Capillary 186 (*) 70 - 99 (mg/dL)   Comment 1 Notify RN     Comment 2 Documented in Chart     No results found for this or any previous visit (from the past 240 hour(s)).  Studies/Results: No results found.  Medications: Scheduled Meds:   . amLODipine  5 mg Oral Daily  . aspirin  325 mg Oral Daily  . cholecalciferol  1,000 Units Oral Daily  . citalopram  20 mg Oral Daily  . dipyridamole-aspirin  1 capsule Oral BID  . enoxaparin  40 mg Subcutaneous Q24H  . fluticasone  1 puff Inhalation BID  . insulin aspart  0-5 Units Subcutaneous QHS  . insulin aspart  0-9 Units Subcutaneous TID WC  . metoprolol  100 mg Oral Daily  . multivitamins ther. w/minerals  1 tablet Oral Daily  . nitroGLYCERIN  0.4 mg  Transdermal Daily  . omega-3 acid ethyl esters  1 g Oral Daily  . pantoprazole  40 mg Oral Q1200  . polyvinyl alcohol  1 drop Both Eyes QID  . rosuvastatin  20 mg Oral Daily  . DISCONTD: albuterol  2 puff Inhalation QID  . DISCONTD: albuterol  5 mg Nebulization Once  . DISCONTD: albuterol  5 mg Nebulization Once  . DISCONTD: furosemide  40 mg Intravenous Q12H  . DISCONTD: lisinopril  10 mg Oral Daily   Continuous Infusions:  PRN Meds:.acetaminophen, ALPRAZolam, HYDROcodone-acetaminophen, hydrOXYzine, levalbuterol, levalbuterol, metoprolol, nitroGLYCERIN, traZODone  Assessment/Plan: Principal Problem:  *Systolic CHF, acute on chronic: Currently appears to be well compensated. We'll continue to hold Lasix and his ACE inhibitor given his dizziness. Active Problems:  COPD (chronic obstructive pulmonary disease): No evidence of acute exacerbation. Continue bronchodilator with Xopenex.  No indication for steroids at present. Continue Advair, which he reports, is improving his symptoms..  Ischemic cardiomyopathy: The patient has seen Dr. Donnie Aho for this in the past. His cardiac markers were negative. We we cycled his enzymes a second time 01/03/11 after he went into atrial fibrillation and these sets were negative as well. He remains on aspirin therapy.  CAD (coronary artery disease): Cardiac markers are negative x 6. Continue aspirin therapy.  Tobacco abuse: The patient reports that he quit smoking approximately 10 months ago.  HTN (hypertension): Continue Norvasc and metoprolol. Hold lisinopril due to his worsening renal function.  Cerebrovascular disease: Continue Aggrenox, and risk factor reduction.  Hyperlipidemia: Continue fish oil and statin therapy.  Atrial Fibrillation with rapid ventricular response: The patient went into atrial fibrillation with rapid ventricular response 01/03/11. 12-lead EKG was done which confirmed this. He is now back in normal sinus rhythm. We repeated a 12-lead EKG  which showed NSR with a 1st degree AV block. His cardiac markers were negative. Patient does report that he has a history of this in the past and has not taken Coumadin for this.  Acute renal failure: Likely secondary to diuretic therapy. We held Lasix as well as his ACE inhibitor and his kidney function normalized. Dizziness:  may be related to resuming his nitroglycerin patch. We will discontinue the patch and monitor him overnight for resolution of symptoms.     LOS: 4 days   Selmer Adduci 01/05/2011, 9:48  AM     

## 2011-01-06 LAB — BASIC METABOLIC PANEL
BUN: 12 mg/dL (ref 6–23)
CO2: 25 mEq/L (ref 19–32)
Calcium: 9.6 mg/dL (ref 8.4–10.5)
Chloride: 97 mEq/L (ref 96–112)
Creatinine, Ser: 0.95 mg/dL (ref 0.50–1.35)
GFR calc Af Amer: >90 mL/min (ref >90)

## 2011-01-06 LAB — GLUCOSE, CAPILLARY: Glucose-Capillary: 132 mg/dL — ABNORMAL HIGH (ref 70–99)

## 2011-01-06 MED ORDER — LEVALBUTEROL TARTRATE 45 MCG/ACT IN AERO: 2.0000 | INHALATION_SPRAY | Freq: Four times a day (QID) | RESPIRATORY_TRACT | Status: DC | PRN

## 2011-01-06 MED ORDER — LEVALBUTEROL HCL 0.63 MG/3ML IN NEBU: 0.6300 mg | INHALATION_SOLUTION | Freq: Four times a day (QID) | RESPIRATORY_TRACT | Status: DC | PRN

## 2011-01-06 NOTE — Discharge Summary (Signed)
Physician Discharge Summary  Patient ID: Jeffrey Melendez MRN: [id number] DOB/AGE: [birth year] 65 y.o.  Admit date: 01/01/2011 Discharge date: 01/06/2011  Primary Care Physician:  [facility]   Discharge Diagnoses:    Present on Admission:  .Systolic CHF, acute on chronic .COPD (chronic obstructive pulmonary disease) .CAD (coronary artery disease) .Hyperlipidemia .Tobacco abuse .HTN (hypertension) .Ischemic cardiomyopathy .Cerebrovascular disease .Hyperglycemia  Discharge Medications:  Current Discharge Medication List    START taking these medications   Details  levalbuterol (XOPENEX HFA) 45 MCG/ACT inhaler Inhale 2 puffs into the lungs every 6 (six) hours as needed for wheezing or shortness of breath. Qty: 1 Inhaler, Refills: 3    levalbuterol (XOPENEX) 0.63 MG/3ML nebulizer solution Take 3 mLs (0.63 mg total) by nebulization every 6 (six) hours as needed for wheezing or shortness of breath. Qty: 3 mL, Refills: 6      CONTINUE these medications which have NOT CHANGED   Details  ALPRAZolam (XANAX) 1 MG tablet Take 1 mg by mouth 3 (three) times daily as needed. For anxiety     amLODipine (NORVASC) 10 MG tablet Take 5 mg by mouth daily.      aspirin 325 MG EC tablet Take 325 mg by mouth daily.      Carboxymethylcellulose Sodium 0.25 % SOLN Place 1 drop into both eyes 4 (four) times daily.      Cholecalciferol 2000 UNITS TABS Take 1 tablet by mouth daily.      citalopram (CELEXA) 40 MG tablet Take 20 mg by mouth daily.      dipyridamole-aspirin (AGGRENOX) 25-200 MG per 12 hr capsule Take 1 capsule by mouth 2 (two) times daily.      fish oil-omega-3 fatty acids 1000 MG capsule Take 1 g by mouth daily.      FLUNISOLIDE, NASAL, NA Place 2 sprays into both nostrils 2 (two) times daily as needed. For allergies     hydrOXYzine (ATARAX/VISTARIL) 25 MG tablet Take 25 mg by mouth 3 (three) times daily as needed. For anxiety      lisinopril (PRINIVIL,ZESTRIL) 10 MG  tablet Take 10 mg by mouth daily.      meloxicam (MOBIC) 15 MG tablet Take 15 mg by mouth daily.      metoprolol (TOPROL-XL) 100 MG 24 hr tablet Take 100 mg by mouth daily.      mometasone (ASMANEX) 220 MCG/INH inhaler Inhale 2 puffs into the lungs 2 (two) times daily.      Multiple Vitamin (MULTIVITAMIN) tablet Take 1 tablet by mouth daily.      omeprazole (PRILOSEC) 20 MG capsule Take 20 mg by mouth daily.      rosuvastatin (CRESTOR) 40 MG tablet Take 20 mg by mouth daily.      traZODone (DESYREL) 100 MG tablet Take 100 mg by mouth at bedtime as needed. For sleep     nitroGLYCERIN (NITROSTAT) 0.4 MG SL tablet Place 0.4 mg under the tongue every 5 (five) minutes as needed. For chest pain       STOP taking these medications     albuterol (PROVENTIL HFA;VENTOLIN HFA) 108 (90 BASE) MCG/ACT inhaler      nitroGLYCERIN (NITRODUR - DOSED IN MG/24 HR) 0.4 mg/hr          Disposition and Follow-up: The patient is being discharged home, with home health PT and home oxygen therapy.  Consults:  None   Significant Diagnostic Studies:  Ct Angio Chest W/cm &/or Wo Cm  01/01/2011  *RADIOLOGY REPORT*  Clinical Data:  Shortness  of breath x1 week, cough, COPD, evaluate for PE  CT ANGIOGRAPHY CHEST WITH CONTRAST  Technique:  Multidetector CT imaging of the chest was performed using the standard protocol during bolus administration of intravenous contrast.  Multiplanar CT image reconstructions including MIPs were obtained to evaluate the vascular anatomy.  Contrast: 80mL OMNIPAQUE IOHEXOL 350 MG/ML IV SOLN  Comparison:  Chest radiograph dated 01/01/2011.  [facility] CT chest dated 06/06/2009.  Findings:  No evidence of pulmonary embolism.  Moderate paraseptal/centrilobular emphysematous changes.  No superimposed opacities suspicious for pneumonia or interstitial edema.  No suspicious pulmonary nodules.  Small bilateral pleural effusions with associated lower lobe atelectasis.  No pneumothorax.   Visualized thyroid is unremarkable.  The heart is top normal in size.  No pericardial effusion. Coronary atherosclerosis.  Atherosclerotic calcifications of the aortic arch.  Visualized upper abdomen is unremarkable.  Visualized osseous structures are within normal limits.  Review of the MIP images confirms the above findings.  IMPRESSION: No evidence of pulmonary embolism.  Moderate paraseptal/centrilobular emphysematous changes.  Small bilateral pleural effusions with associated lower lobe atelectasis.  Original Report Authenticated By: Charline Bills, M.D.   Dg Chest Portable 1 View  01/03/2011  *RADIOLOGY REPORT*  Clinical Data: Evaluate congestive heart failure  PORTABLE CHEST - 1 VIEW  Comparison: 01/01/2011; 06/03/2009; chest CT - 01/01/2011  Findings: Grossly unchanged cardiac silhouette and mediastinal contours. Grossly unchanged increased conspicuity of the pulmonary interstitium compatible with known extensive underlying centrilobular emphysematous changes as demonstrated on recent chest CT.  Grossly unchanged bibasilar heterogeneous opacities.  No definite pleural effusion or pneumothorax.  Grossly unchanged bones.  IMPRESSION: Marked centrilobular emphysematous change without definite superimposed acute cardiopulmonary process.  Original Report Authenticated By: Waynard Reeds, M.D.   Dg Chest Portable 1 View  01/01/2011  *RADIOLOGY REPORT*  Clinical Data: Shortness of breath  PORTABLE CHEST - 1 VIEW  Comparison: 06/03/2009  Findings: Mild diffuse interstitial edema or infiltrates bilaterally.  No definite effusion.  Mild cardiomegaly. Atheromatous aortic arch.  Regional bones unremarkable.  IMPRESSION:  1.  Mild cardiomegaly with new diffuse interstitial edema or infiltrates.  Original Report Authenticated By: Osa Craver, M.D.   2-D Echocardiogram 01/02/11 Study Conclusions  - Left ventricle: The cavity size was moderately dilated. Systolic function was moderately reduced. The  estimated ejection fraction was in the range of 35% to 40%. Akinesis of the mid-distalanteroseptal, anterior, and apical myocardium. Hypokinesis of the inferior myocardium. - Mitral valve: Moderate regurgitation directed posteriorly. - Left atrium: The atrium was moderately dilated.    Discharge Laboratory Values: Results for orders placed during the hospital encounter of 01/01/11 (from the past 48 hour(s))  GLUCOSE, CAPILLARY     Status: Abnormal   Collection Time   01/04/11  4:49 PM      Component Value Range Comment   Glucose-Capillary 206 (*) 70 - 99 (mg/dL)   GLUCOSE, CAPILLARY     Status: Abnormal   Collection Time   01/04/11  9:07 PM      Component Value Range Comment   Glucose-Capillary 200 (*) 70 - 99 (mg/dL)    Comment 1 Notify RN     BASIC METABOLIC PANEL     Status: Abnormal   Collection Time   01/05/11  5:40 AM      Component Value Range Comment   Sodium 132 (*) 135 - 145 (mEq/L)    Potassium 4.1  3.5 - 5.1 (mEq/L)    Chloride 97  96 - 112 (mEq/L)  CO2 26  19 - 32 (mEq/L)    Glucose, Bld 230 (*) 70 - 99 (mg/dL)    BUN 14  6 - 23 (mg/dL)    Creatinine, Ser 4.54  0.50 - 1.35 (mg/dL)    Calcium 9.3  8.4 - 10.5 (mg/dL)    GFR calc non Af Amer 78 (*) >90 (mL/min)    GFR calc Af Amer >90  >90 (mL/min)   CBC     Status: Abnormal   Collection Time   01/05/11  5:40 AM      Component Value Range Comment   WBC 10.9 (*) 4.0 - 10.5 (K/uL)    RBC 3.55 (*) 4.22 - 5.81 (MIL/uL)    Hemoglobin 12.0 (*) 13.0 - 17.0 (g/dL)    HCT 09.8 (*) 11.9 - 52.0 (%)    MCV 98.0  78.0 - 100.0 (fL)    MCH 33.8  26.0 - 34.0 (pg)    MCHC 34.5  30.0 - 36.0 (g/dL)    RDW 14.7  82.9 - 56.2 (%)    Platelets 172  150 - 400 (K/uL)   GLUCOSE, CAPILLARY     Status: Abnormal   Collection Time   01/05/11  7:36 AM      Component Value Range Comment   Glucose-Capillary 186 (*) 70 - 99 (mg/dL)    Comment 1 Notify RN      Comment 2 Documented in Chart     GLUCOSE, CAPILLARY     Status: Abnormal    Collection Time   01/05/11 11:37 AM      Component Value Range Comment   Glucose-Capillary 146 (*) 70 - 99 (mg/dL)    Comment 1 Documented in Chart      Comment 2 Notify RN     GLUCOSE, CAPILLARY     Status: Abnormal   Collection Time   01/05/11  5:06 PM      Component Value Range Comment   Glucose-Capillary 233 (*) 70 - 99 (mg/dL)    Comment 1 Notify RN      Comment 2 Documented in Chart     GLUCOSE, CAPILLARY     Status: Abnormal   Collection Time   01/05/11  9:06 PM      Component Value Range Comment   Glucose-Capillary 216 (*) 70 - 99 (mg/dL)   BASIC METABOLIC PANEL     Status: Abnormal   Collection Time   01/06/11  5:10 AM      Component Value Range Comment   Sodium 132 (*) 135 - 145 (mEq/L)    Potassium 4.2  3.5 - 5.1 (mEq/L)    Chloride 97  96 - 112 (mEq/L)    CO2 25  19 - 32 (mEq/L)    Glucose, Bld 220 (*) 70 - 99 (mg/dL)    BUN 12  6 - 23 (mg/dL)    Creatinine, Ser 1.30  0.50 - 1.35 (mg/dL)    Calcium 9.6  8.4 - 10.5 (mg/dL)    GFR calc non Af Amer 87 (*) >90 (mL/min)    GFR calc Af Amer >90  >90 (mL/min)   GLUCOSE, CAPILLARY     Status: Abnormal   Collection Time   01/06/11 11:50 AM      Component Value Range Comment   Glucose-Capillary 132 (*) 70 - 99 (mg/dL)     Brief H and P: For complete details please refer to admission H and P, but in brief Jeffrey Melendez is a 65 year old caucasian male with history of  a COPD who presented to [facility] because of shortness of breath of about a week's duration and this was said to be getting progressively worse. The patient denied any history of chest pain. He denied any fever. He denied any chills. He denied any rigor. Associated with shortness of breath is slight swelling of the lower extremity. The patient also  gave a history of 3 pillow orthopnea. The patient denied any fever. He denied any chills. He denied any rigor. He subsequently was referred to the hospitalist service for further evaluation of his respiratory  symptoms.    Physical Exam at Discharge: BP 124/77  Pulse 67  Temp(Src) 97.3 F (36.3 C) (Oral)  Resp 18  Ht 5\' 7"  (1.702 m)  Wt 75.2 kg (165 lb 12.6 oz)  BMI 25.97 kg/m2  SpO2 95%  Gen: No acute distress Cardiovascular: Regular rate, rhythm.  No rubs, murmurs or gallops. Respiratory: Faint wheezes bilaterally.  Good air movement.  No crackles. Gastrointestinal: Soft, + bowel sounds. Ext: No edema.    Hospital Course:  Principal Problem:  *Systolic CHF, acute on chronic: The patient was admitted and put on BID dose lasix.  A 2-D Echocardiogram was done with the findings as noted above.  His diuretics were held when his creatinine doubled.  His creatinine subsequently normalized.  He is being discharged on his ACE inhibitor.  He is currently compensated, off lasix therapy. Active Problems:  COPD (chronic obstructive pulmonary disease): There was no evidence of acute exacerbation. We put him on bronchodilator with Xopenex secondary to transient atrial fibrillation.  Ischemic cardiomyopathy: The patient has seen Dr. Donnie Aho for this in the past. His cardiac markers were negative. We we cycled his enzymes a second time 01/03/11 after he went into atrial fibrillation and these sets were negative as well. He remains on aspirin therapy at discharge.  CAD (coronary artery disease): Cardiac markers were negative x 6. Continue aspirin therapy.  Tobacco abuse: The patient reports that he quit smoking approximately 10 months ago.  HTN (hypertension): Continue Norvasc, lisinopril and metoprolol. Cerebrovascular disease: Continue Aggrenox, and risk factor reduction.  Hyperlipidemia: Continue fish oil and statin therapy.  Atrial Fibrillation with rapid ventricular response: The patient went into atrial fibrillation with rapid ventricular response 01/03/11. 12-lead EKG was done which confirmed this. He is now back in normal sinus rhythm. We repeated a 12-lead EKG which showed NSR with a 1st degree AV  block. His cardiac markers were negative. Patient does report that he has a history of this in the past and has not taken Coumadin for this.  Acute renal failure: Likely secondary to diuretic therapy. We held Lasix as well as his ACE inhibitor and his kidney function normalized. He can resume his ACE inhibitor at discharge. Dizziness: Felt to be related to resuming his nitroglycerin patch. We discontinued the patch, and his symptoms resolved.  Recommendations for hospital follow-up: 1.  F/U renal function in 1 week.  Time spent on Discharge: 45 minutes.  Signed: Dr. Trula Ore Demetra Moya Pager [phone] 01/06/2011, 1:03 PM

## 2011-01-06 NOTE — Progress Notes (Signed)
Received a CM referral for [name] needs. Patient had been d/ced prior to my seeing him. Unable to reach him at Jeffrey number listed in Epic, contacted [facility] who provided a walker, they had a family member # for Jeffrey Melendez at [phone]. Called Jeffrey Melendez who connected me to Jeffrey Melendez. I discussed what was ordered for HH (O2, RT, Nebulizer, RW, PT), informed him that he did not qualify for O2 per sats recorded. He did not think he needed O2. Discussed arranging to have Jeffrey nebulizer delivered, patient has RW that was delivered Sat. Discussed who he wanted for Skilled services, he wanted to go with [facility]. Contacted Lawernce Keas for DME and [facility] for [facility] services, arranged HHRN and PT. Original order for [facility] PT and respiratory, discussed with MD to change to RN and PT. Patient provided new phone # [phone]. [facility] to f/u with patient at home. [facility]

## 2011-01-21 ENCOUNTER — Emergency Department (HOSPITAL_COMMUNITY): Payer: Medicare Other

## 2011-01-21 ENCOUNTER — Inpatient Hospital Stay (HOSPITAL_COMMUNITY)
Admission: EM | Admit: 2011-01-21 | Discharge: 2011-01-24 | DRG: 190 | Disposition: A | Discharge status: Home / Self Care | Payer: Medicare Other | Attending: Internal Medicine | Admitting: Internal Medicine

## 2011-01-21 ENCOUNTER — Encounter (HOSPITAL_COMMUNITY): Payer: Self-pay | Admitting: Emergency Medicine

## 2011-01-21 DIAGNOSIS — E876 Hypokalemia: Secondary | ICD-10-CM | POA: Diagnosis present

## 2011-01-21 DIAGNOSIS — I509 Heart failure, unspecified: Secondary | ICD-10-CM | POA: Diagnosis present

## 2011-01-21 DIAGNOSIS — I255 Ischemic cardiomyopathy: Secondary | ICD-10-CM | POA: Diagnosis present

## 2011-01-21 DIAGNOSIS — I10 Essential (primary) hypertension: Secondary | ICD-10-CM | POA: Diagnosis present

## 2011-01-21 DIAGNOSIS — I5023 Acute on chronic systolic (congestive) heart failure: Secondary | ICD-10-CM | POA: Diagnosis present

## 2011-01-21 DIAGNOSIS — J441 Chronic obstructive pulmonary disease with (acute) exacerbation: Principal | ICD-10-CM | POA: Diagnosis present

## 2011-01-21 DIAGNOSIS — I2589 Other forms of chronic ischemic heart disease: Secondary | ICD-10-CM | POA: Diagnosis present

## 2011-01-21 DIAGNOSIS — J449 Chronic obstructive pulmonary disease, unspecified: Secondary | ICD-10-CM

## 2011-01-21 DIAGNOSIS — I2510 Atherosclerotic heart disease of native coronary artery without angina pectoris: Secondary | ICD-10-CM | POA: Diagnosis present

## 2011-01-21 DIAGNOSIS — I252 Old myocardial infarction: Secondary | ICD-10-CM

## 2011-01-21 DIAGNOSIS — Z9861 Coronary angioplasty status: Secondary | ICD-10-CM

## 2011-01-21 HISTORY — DX: Hyperlipidemia, unspecified: E78.5

## 2011-01-21 HISTORY — DX: Ischemic cardiomyopathy: I25.5

## 2011-01-21 HISTORY — DX: Anxiety disorder, unspecified: F41.9

## 2011-01-21 HISTORY — DX: Heart failure, unspecified: I50.9

## 2011-01-21 LAB — CBC
Platelets: 214 10*3/uL (ref 150–400)
RBC: 3.76 MIL/uL — ABNORMAL LOW (ref 4.22–5.81)
RDW: 12.6 % (ref 11.5–15.5)
WBC: 7.3 10*3/uL (ref 4.0–10.5)

## 2011-01-21 LAB — POCT I-STAT TROPONIN I: Troponin i, poc: 0.01 ng/mL (ref 0.00–0.08)

## 2011-01-21 LAB — BASIC METABOLIC PANEL
CO2: 23 mEq/L (ref 19–32)
Calcium: 9.7 mg/dL (ref 8.4–10.5)
Creatinine, Ser: 0.83 mg/dL (ref 0.50–1.35)
GFR calc Af Amer: >90 mL/min (ref >90)
GFR calc non Af Amer: >90 mL/min (ref >90)
Sodium: 139 mEq/L (ref 135–145)

## 2011-01-21 LAB — PRO B NATRIURETIC PEPTIDE: Pro B Natriuretic peptide (BNP): 1147 pg/mL — ABNORMAL HIGH (ref 0–125)

## 2011-01-21 MED ORDER — ALPRAZOLAM 0.5 MG PO TABS
1.0000 mg | ORAL_TABLET | Freq: Once | ORAL | Status: AC
  Administered 2011-01-21: 1 mg via ORAL
  Filled 2011-01-21: qty 1

## 2011-01-21 MED ORDER — FUROSEMIDE 10 MG/ML IJ SOLN
20.0000 mg | Freq: Once | INTRAMUSCULAR | Status: AC
  Administered 2011-01-21: 20 mg via INTRAVENOUS
  Filled 2011-01-21: qty 2

## 2011-01-21 MED ORDER — NITROGLYCERIN 2 % TD OINT
1.0000 [in_us] | TOPICAL_OINTMENT | Freq: Once | TRANSDERMAL | Status: AC
  Administered 2011-01-21: 1 [in_us] via TOPICAL
  Filled 2011-01-21: qty 30

## 2011-01-21 MED ORDER — AZITHROMYCIN 500 MG IV SOLR
500.0000 mg | Freq: Once | INTRAVENOUS | Status: AC
  Administered 2011-01-22: 500 mg via INTRAVENOUS
  Filled 2011-01-21: qty 500

## 2011-01-21 MED ORDER — METHYLPREDNISOLONE SODIUM SUCC 125 MG IJ SOLR
125.0000 mg | Freq: Once | INTRAMUSCULAR | Status: AC
  Administered 2011-01-22: 125 mg via INTRAVENOUS
  Filled 2011-01-21: qty 2

## 2011-01-21 MED ORDER — IPRATROPIUM BROMIDE 0.02 % IN SOLN
0.5000 mg | Freq: Once | RESPIRATORY_TRACT | Status: AC
  Administered 2011-01-21: 0.5 mg via RESPIRATORY_TRACT
  Filled 2011-01-21: qty 2.5

## 2011-01-21 MED ORDER — ALBUTEROL SULFATE (5 MG/ML) 0.5% IN NEBU
5.0000 mg | INHALATION_SOLUTION | Freq: Once | RESPIRATORY_TRACT | Status: AC
  Administered 2011-01-21: 5 mg via RESPIRATORY_TRACT
  Filled 2011-01-21: qty 1

## 2011-01-21 NOTE — H&P (Signed)
PCP: The patient receives his care at the [place]    Chief Complaint: Shortness of breath  for 1 month, worsening tonight   HPI: Jeffrey Melendez is an 65 y.o. male with history of emphysema, ischemic cardiomyopathy, congestive heart failure with recent ejection fraction of 35%, peripheral vascular disease, prior CVA, prior myocardial infarction, presents to [facility] emergency room complaining of shortness of breath with minimal cough for the past month. He was recently admitted for congestive heart failure, was diuresed, but did not take Lasix outpatient. Evaluation in the emergency room included a chest x-ray which showed mild failure but no infiltrate, and increased bilateral pleural effusions.   His cardiac markers were negative and he has a normal white count. His EKG showed left bundle branch block, but otherwise no acute ST-T changes.  He was given several nebulizer treatments and small dose of intravenous Lasix, and hospitalist were asked to admit him for further evaluation and treatment. Rewiew of Systems:  The patient denies anorexia, fever, weight loss,, vision loss, decreased hearing, hoarseness, chest pain, syncope, , peripheral edema, balance deficits, hemoptysis, abdominal pain, melena, hematochezia, severe indigestion/heartburn, hematuria, incontinence, genital sores, muscle weakness, suspicious skin lesions, transient blindness, difficulty walking, depression, unusual weight change, abnormal bleeding, enlarged lymph nodes, angioedema, and breast masses   Past Medical History  Diagnosis Date  . COPD (chronic obstructive pulmonary disease)   . Myocardial infarction   . Stroke   . PAD (peripheral artery disease)   . CHF (congestive heart failure)     Past Surgical History  Procedure Date  . Coronary stent placement   . Arm surgery   . Foot surgery   . Head surgery   . Facial fracture surgery   . Knee surgery     Medications:  HOME MEDS: Prior to Admission medications     Medication Sig Start Date End Date Taking? Authorizing Provider  ALPRAZolam Prudy Feeler) 1 MG tablet Take 1 mg by mouth 3 (three) times daily as needed. For anxiety    Yes Historical Provider, MD  amLODipine (NORVASC) 10 MG tablet Take 5 mg by mouth daily.     Yes Historical Provider, MD  aspirin 325 MG EC tablet Take 325 mg by mouth daily.     Yes Historical Provider, MD  Carboxymethylcellulose Sodium 0.25 % SOLN Place 1 drop into both eyes 4 (four) times daily.     Yes Historical Provider, MD  Cholecalciferol 2000 UNITS TABS Take 1 tablet by mouth daily.     Yes Historical Provider, MD  citalopram (CELEXA) 40 MG tablet Take 20 mg by mouth daily.     Yes Historical Provider, MD  dipyridamole-aspirin (AGGRENOX) 25-200 MG per 12 hr capsule Take 1 capsule by mouth 2 (two) times daily.     Yes Historical Provider, MD  fish oil-omega-3 fatty acids 1000 MG capsule Take 1 g by mouth daily.     Yes Historical Provider, MD  FLUNISOLIDE, NASAL, NA Place 2 sprays into both nostrils 2 (two) times daily as needed. For allergies    Yes Historical Provider, MD  hydrOXYzine (ATARAX/VISTARIL) 25 MG tablet Take 25 mg by mouth 3 (three) times daily as needed. For anxiety     Yes Historical Provider, MD  levalbuterol [facility] HFA) 45 MCG/ACT inhaler Inhale 2 puffs into the lungs every 6 (six) hours as needed for wheezing or shortness of breath. 01/06/11 01/06/12 Yes Christina Rama  levalbuterol (XOPENEX) 0.63 MG/3ML nebulizer solution Take 3 mLs (0.63 mg total) by nebulization every  6 (six) hours as needed for wheezing or shortness of breath. 01/06/11 01/06/12 Yes Christina Rama  lisinopril (PRINIVIL,ZESTRIL) 10 MG tablet Take 10 mg by mouth daily.     Yes Historical Provider, MD  meloxicam (MOBIC) 15 MG tablet Take 15 mg by mouth daily.     Yes Historical Provider, MD  metoprolol (TOPROL-XL) 100 MG 24 hr tablet Take 100 mg by mouth daily.     Yes Historical Provider, MD  mometasone [facility]) 220 MCG/INH inhaler Inhale 2  puffs into the lungs 2 (two) times daily.     Yes Historical Provider, MD  Multiple Vitamin (MULTIVITAMIN) tablet Take 1 tablet by mouth daily.     Yes Historical Provider, MD  nitroGLYCERIN (NITROSTAT) 0.4 MG SL tablet Place 0.4 mg under the tongue every 5 (five) minutes as needed. For chest pain    Yes Historical Provider, MD  omeprazole (PRILOSEC) 20 MG capsule Take 20 mg by mouth daily.     Yes Historical Provider, MD  rosuvastatin (CRESTOR) 40 MG tablet Take 20 mg by mouth daily.     Yes Historical Provider, MD  traZODone (DESYREL) 100 MG tablet Take 100 mg by mouth at bedtime as needed. For sleep    Yes Historical Provider, MD     Allergies:  Allergies  Allergen Reactions  . Other     Med that started with a "G" for leg circulation-"goes against my heart"  . Wellbutrin (Bupropion Hcl)     Elevated BP  . Niacin And Related Rash    Social History:   reports that he has quit smoking. He does not have any smokeless tobacco history on file. He reports that he does not drink alcohol. His drug history not on file.  Family History: History reviewed. No pertinent family history.   Physical Exam: Filed Vitals:   01/21/11 1958 01/21/11 2158 01/21/11 2205 01/21/11 2342  BP:    125/67  Pulse:    79  Temp:    97.9 F (36.6 C)  TempSrc:    Oral  Resp:    16  SpO2: 94% 97% 93% 92%   Blood pressure 125/67, pulse 79, temperature 97.9 F (36.6 C), temperature source Oral, resp. rate 16, SpO2 92.00%.  GEN:  Pleasant  person lying in the stretcher in no acute distress; cooperative with exam PSYCH:  alert and oriented x4; does not appear anxious does not appear depressed; affect is normal HEENT: Mucous membranes pink and anicteric; PERRLA; EOM intact; no cervical lymphadenopathy nor thyromegaly or carotid bruit; no JVD; Breasts:: Not examined CHEST WALL: No tenderness CHEST: Normal respiration, but with decreased breath sounds throughout.  Although there are no crackles he does have both  inspiratory and expiratory wheezes. HEART: Regular rate and rhythm; no murmurs rubs or gallops BACK: No kyphosis or scoliosis; no CVA tenderness ABDOMEN: Obese, soft non-tender; no masses, no organomegaly, normal abdominal bowel sounds; no pannus; no intertriginous candida. Rectal Exam: Not done EXTREMITIES: No bone or joint deformity; age-appropriate arthropathy of the hands and knees; no edema; no ulcerations. Genitalia: not examined PULSES: 2+ and symmetric SKIN: Normal hydration no rash or ulceration CNS: Cranial nerves 2-12 grossly intact no focal neurologic deficit   Labs & Imaging Results for orders placed during the hospital encounter of 01/21/11 (from the past 48 hour(s))  PRO B NATRIURETIC PEPTIDE     Status: Abnormal   Collection Time   01/21/11  6:20 PM      Component Value Range Comment   BNP, POC  1147.0 (*) 0 - 125 (pg/mL)   BASIC METABOLIC PANEL     Status: Abnormal   Collection Time   01/21/11  6:20 PM      Component Value Range Comment   Sodium 139  135 - 145 (mEq/L)    Potassium 3.7  3.5 - 5.1 (mEq/L)    Chloride 104  96 - 112 (mEq/L)    CO2 23  19 - 32 (mEq/L)    Glucose, Bld 134 (*) 70 - 99 (mg/dL)    BUN 7  6 - 23 (mg/dL)    Creatinine, Ser 8.29  0.50 - 1.35 (mg/dL)    Calcium 9.7  8.4 - 10.5 (mg/dL)    GFR calc non Af Amer >90  >90 (mL/min)    GFR calc Af Amer >90  >90 (mL/min)   CBC     Status: Abnormal   Collection Time   01/21/11  6:20 PM      Component Value Range Comment   WBC 7.3  4.0 - 10.5 (K/uL)    RBC 3.76 (*) 4.22 - 5.81 (MIL/uL)    Hemoglobin 12.8 (*) 13.0 - 17.0 (g/dL)    HCT 56.2 (*) 13.0 - 52.0 (%)    MCV 96.5  78.0 - 100.0 (fL)    MCH 34.0  26.0 - 34.0 (pg)    MCHC 35.3  30.0 - 36.0 (g/dL)    RDW 86.5  78.4 - 69.6 (%)    Platelets 214  150 - 400 (K/uL)   POCT I-STAT TROPONIN I     Status: Normal   Collection Time   01/21/11  6:30 PM      Component Value Range Comment   Troponin i, poc 0.01  0.00 - 0.08 (ng/mL)    Comment 3              Dg Chest 2 View  01/21/2011  *RADIOLOGY REPORT*  Clinical Data: Congestive heart failure, short of breath  CHEST - 2 VIEW  Comparison: None.  Findings: Normal mediastinum and heart silhouette.  There are bilateral pleural effusions which are increased compared to prior. There is chronic bronchitic markings centrally.  Lungs are hyperinflated.  No acute osseous abnormality.  IMPRESSION:  1.  Increase in small bilateral pleural effusions. 2.  Emphysematous change .  Original Report Authenticated By: Genevive Bi, M.D.      Assessment Present on Admission:  .Systolic CHF, acute on chronic .Ischemic cardiomyopathy .COPD (chronic obstructive pulmonary disease) .HTN (hypertension)   PLAN:  Jeffrey Melendez has both COPD exacerbation and mild congestive heart failure.  She was given steroid in the emergency room and I will continue him on by mouth prednisone.  Will also give him intravenous Zithromax. He'll be diuresed gently with daily intravenous Lasix.   Since he had recent echo, I would not repeat.   He is otherwise stable, full code,  and will be continued on his home medications.  Although he has history of ischemic cardiomyopathy, I do not think he had an acute coronary syndrome. We'll rule out with serial CPKs and troponins. He is stable and will be admitted to [facility] [facility] service.  Other plans as per orders.  Bond Grieshop 01/21/2011, 11:46 PM

## 2011-01-21 NOTE — ED Notes (Signed)
I. Knapp, MD at bedside. 

## 2011-01-21 NOTE — ED Provider Notes (Signed)
History     CSN: [id number] Arrival date & time: 01/21/2011  4:19 PM   First MD Initiated Contact with Patient 01/21/11 1739      Chief Complaint  Patient presents with  . Shortness of Breath    (Consider location/radiation/quality/duration/timing/severity/associated sxs/prior treatment) Patient is a 65 y.o. male presenting with shortness of breath. The history is provided by the patient.  Shortness of Breath  The current episode started more than 1 week ago. The onset was gradual. The problem occurs continuously. The problem has been gradually worsening. The problem is severe. The symptoms are relieved by nothing. The symptoms are aggravated by activity and a supine position. Associated symptoms include orthopnea and shortness of breath. Pertinent negatives include no chest pain, no chest pressure, no fever and no cough. He has had prior hospitalizations. Past medical history comments: HX COPD, CHF. He has been behaving normally. There were no sick contacts. Recent Medical Care: Admitted to hospital on 11/7 with d/c on 11/12 for CHF exacerbation.  Was given lasix while admitted recently but did not go home with any. Increasing SOB since that time. Was unable to obtain a prompt f/u appt with his PCP. Denies abd or leg swelling. Reports significant improvement in SOB after administration of nasal O2 by triage nurse.   Past Medical History  Diagnosis Date  . COPD (chronic obstructive pulmonary disease)   . Myocardial infarction   . Stroke   . PAD (peripheral artery disease)   . CHF (congestive heart failure)     Past Surgical History  Procedure Date  . Coronary stent placement   . Arm surgery   . Foot surgery   . Head surgery   . Facial fracture surgery   . Knee surgery     History reviewed. No pertinent family history.  History  Substance Use Topics  . Smoking status: Former Games developer  . Smokeless tobacco: Not on file  . Alcohol Use: No      Review of Systems    Constitutional: Negative for fever, chills and diaphoresis.  HENT: Negative for ear pain, congestion, neck pain and neck stiffness.   Eyes: Negative for pain and visual disturbance.  Respiratory: Positive for shortness of breath. Negative for cough and chest tightness.   Cardiovascular: Positive for orthopnea. Negative for chest pain, palpitations and leg swelling.  Gastrointestinal: Negative for nausea, vomiting, abdominal pain and abdominal distention.  Genitourinary: Negative for dysuria and flank pain.  Musculoskeletal: Negative for back pain and gait problem.  Skin: Negative for rash and wound.  Neurological: Negative for dizziness, speech difficulty, weakness and headaches.  Hematological: Does not bruise/bleed easily.  Psychiatric/Behavioral: Negative for behavioral problems and confusion.    Allergies  Other; Wellbutrin; and Niacin and related  Home Medications   Current Outpatient Rx  Name Route Sig Dispense Refill  . ALPRAZOLAM 1 MG PO TABS Oral Take 1 mg by mouth 3 (three) times daily as needed. For anxiety     . AMLODIPINE BESYLATE 10 MG PO TABS Oral Take 5 mg by mouth daily.      . ASPIRIN 325 MG PO TBEC Oral Take 325 mg by mouth daily.      Marland Kitchen CARBOXYMETHYLCELLULOSE SODIUM 0.25 % OP SOLN Both Eyes Place 1 drop into both eyes 4 (four) times daily.      . CHOLECALCIFEROL 2000 UNITS PO TABS Oral Take 1 tablet by mouth daily.      Marland Kitchen CITALOPRAM HYDROBROMIDE 40 MG PO TABS Oral Take 20 mg by  mouth daily.      . ASPIRIN-DIPYRIDAMOLE 25-200 MG PO CP12 Oral Take 1 capsule by mouth 2 (two) times daily.      . OMEGA-3 FATTY ACIDS 1000 MG PO CAPS Oral Take 1 g by mouth daily.      Marland Kitchen FLUNISOLIDE (NASAL) NA Each Nare Place 2 sprays into both nostrils 2 (two) times daily as needed. For allergies     . HYDROXYZINE HCL 25 MG PO TABS Oral Take 25 mg by mouth 3 (three) times daily as needed. For anxiety      . LEVALBUTEROL TARTRATE 45 MCG/ACT IN AERO Inhalation Inhale 2 puffs into the  lungs every 6 (six) hours as needed for wheezing or shortness of breath. 1 Inhaler 3  . LEVALBUTEROL HCL 0.63 MG/3ML IN NEBU Nebulization Take 3 mLs (0.63 mg total) by nebulization every 6 (six) hours as needed for wheezing or shortness of breath. 3 mL 6  . LISINOPRIL 10 MG PO TABS Oral Take 10 mg by mouth daily.      . MELOXICAM 15 MG PO TABS Oral Take 15 mg by mouth daily.      Marland Kitchen METOPROLOL SUCCINATE 100 MG PO TB24 Oral Take 100 mg by mouth daily.      . MOMETASONE FUROATE 220 MCG/INH IN AEPB Inhalation Inhale 2 puffs into the lungs 2 (two) times daily.      Marland Kitchen ONE-DAILY MULTI VITAMINS PO TABS Oral Take 1 tablet by mouth daily.      Marland Kitchen NITROGLYCERIN 0.4 MG SL SUBL Sublingual Place 0.4 mg under the tongue every 5 (five) minutes as needed. For chest pain     . OMEPRAZOLE 20 MG PO CPDR Oral Take 20 mg by mouth daily.      Marland Kitchen ROSUVASTATIN CALCIUM 40 MG PO TABS Oral Take 20 mg by mouth daily.      . TRAZODONE HCL 100 MG PO TABS Oral Take 100 mg by mouth at bedtime as needed. For sleep       BP 142/82  Pulse 56  Temp(Src) 97.5 F (36.4 C) (Oral)  Resp 17  SpO2 94%  Physical Exam  Nursing note and vitals reviewed. Constitutional: He is oriented to person, place, and time. He appears well-developed and well-nourished. He appears distressed.  HENT:  Head: Normocephalic and atraumatic.  Right Ear: External ear normal.  Left Ear: External ear normal.  Mouth/Throat: Oropharynx is clear and moist.  Eyes: EOM are normal. Pupils are equal, round, and reactive to light.  Neck: Normal range of motion. Neck supple.  Cardiovascular: Normal rate, regular rhythm and normal heart sounds.   Pulmonary/Chest: He is in respiratory distress. He has decreased breath sounds in the right middle field, the right lower field, the left upper field, the left middle field and the left lower field. He has no wheezes. He has no rhonchi. He has no rales.       Mild accessory muscle usage  Abdominal: Soft. Bowel sounds are  normal. He exhibits no distension. There is no tenderness.  Musculoskeletal: Normal range of motion. He exhibits no edema and no tenderness.  Lymphadenopathy:    He has no cervical adenopathy.  Neurological: He is alert and oriented to person, place, and time. No cranial nerve deficit.  Skin: Skin is warm and dry. No rash noted.  Psychiatric: He has a normal mood and affect. His behavior is normal.    ED Course  Procedures (including critical care time)  Labs Reviewed  PRO B NATRIURETIC PEPTIDE -  Abnormal; Notable for the following:    BNP, POC 1147.0 (*)    All other components within normal limits  BASIC METABOLIC PANEL - Abnormal; Notable for the following:    Glucose, Bld 134 (*)    All other components within normal limits  CBC - Abnormal; Notable for the following:    RBC 3.76 (*)    Hemoglobin 12.8 (*)    HCT 36.3 (*)    All other components within normal limits  POCT I-STAT TROPONIN I  I-STAT TROPONIN I   Dg Chest 2 View  01/21/2011  *RADIOLOGY REPORT*  Clinical Data: Congestive heart failure, short of breath  CHEST - 2 VIEW  Comparison: None.  Findings: Normal mediastinum and heart silhouette.  There are bilateral pleural effusions which are increased compared to prior. There is chronic bronchitic markings centrally.  Lungs are hyperinflated.  No acute osseous abnormality.  IMPRESSION:  1.  Increase in small bilateral pleural effusions. 2.  Emphysematous change .  Original Report Authenticated By: Genevive Bi, M.D.     1. COPD 2. CHF   MDM  Hx CHF, not taking a diuretic at this time. Worsening SOB, unable to see PCP, unable to get home O2 through the [place] as of yet. No fever, CXR shows increased bilateral pleural effusions. Almost no air movement on auscultation. Neb tx, lasix ordered, will continue to monitor and determine whether admission is needed after medication administration.        Elwyn Reach [place], [place] 01/21/11 2027  Shaaron Adler,  [place] 01/21/11 2027

## 2011-01-21 NOTE — ED Provider Notes (Addendum)
Pt recently discharged from the hospital for CHF flare-up. He relates he wasn't given lasix at discharge, he persents today with worsening SOB. He states the nasal oxygen given in the ED has helped his breathing.   Pt is alert,  Has mild tachypnea when talking. Has minimal retractions. He has diffuse diminished breath sounds without rhonchi, wheezing or rales.    Medical screening examination/treatment/procedure(s) were conducted as a shared visit with non-physician practitioner(s) and myself.  I personally evaluated the patient during the encounter Devoria Albe, MD, Franz Dell, MD 01/21/11 1610  Ward Givens, MD 01/21/11 2000

## 2011-01-21 NOTE — ED Provider Notes (Signed)
Patient not appreciably better. He still moves little air despite Lasix and Albuterol treatment. He reports he has not been able to start Lasix at home because of delays getting into the [place] in [place]. Will admit.  Rodena Medin, PA 01/21/11 2300

## 2011-01-21 NOTE — ED Notes (Signed)
Narvaez, PA at bedside.  

## 2011-01-21 NOTE — ED Notes (Signed)
Le, MD at bedside.  

## 2011-01-21 NOTE — ED Notes (Signed)
PA at bedside.

## 2011-01-21 NOTE — ED Provider Notes (Signed)
6:40 PM  Date: 01/21/2011  Rate: 52  Rhythm: normal sinus rhythm  QRS Axis: normal  Intervals: PR prolonged  ST/T Wave abnormalities: normal  Conduction Disutrbances:left bundle branch block  Narrative Interpretation: Abnormal EKG  Old EKG Reviewed: unchanged    Carleene Cooper III, MD 01/21/11 1843

## 2011-01-21 NOTE — ED Notes (Signed)
Pt c/o sob x several weeks, pt was in hospital for same, told he has CHF, unable to get to [place] until 12/7,  S/s still occuring. Pt noted to be audible wheezing. No edema in lower extremities.

## 2011-01-21 NOTE — ED Provider Notes (Signed)
The patient continues to appear short of breath. He has put out 600 cc of urine since being given Lasix, and has had one breathing treatment. He reports feeling no better. Will get room air pulse ox and continue to observe.  Rodena Medin, PA 01/23/11 [phone]

## 2011-01-21 NOTE — ED Notes (Signed)
Patient taken off oxygen to obtain room air O2 sat.

## 2011-01-21 NOTE — ED Notes (Signed)
Room air O2 sat 89-90%. Lauralee Evener, PA aware. Patient put back on O2 @ 2l/min via [place].

## 2011-01-22 ENCOUNTER — Encounter (HOSPITAL_COMMUNITY): Payer: Self-pay

## 2011-01-22 LAB — CARDIAC PANEL(CRET KIN+CKTOT+MB+TROPI)
CK, MB: 2.8 ng/mL (ref 0.3–4.0)
Relative Index: 2.9 — ABNORMAL HIGH (ref 0.0–2.5)
Relative Index: 2.8 — ABNORMAL HIGH (ref 0.0–2.5)
Total CK: 100 U/L (ref 7–232)
Troponin I: <0.3 ng/mL (ref <0.30)
Troponin I: <0.3 ng/mL (ref <0.30)
Troponin I: <0.3 ng/mL (ref <0.30)

## 2011-01-22 LAB — BASIC METABOLIC PANEL
BUN: 8 mg/dL (ref 6–23)
CO2: 24 mEq/L (ref 19–32)
Calcium: 9.1 mg/dL (ref 8.4–10.5)
Chloride: 102 mEq/L (ref 96–112)
Creatinine, Ser: 0.85 mg/dL (ref 0.50–1.35)
Glucose, Bld: 211 mg/dL — ABNORMAL HIGH (ref 70–99)

## 2011-01-22 LAB — TSH: TSH: 1.277 u[IU]/mL (ref 0.350–4.500)

## 2011-01-22 LAB — CBC
HCT: 33.9 % — ABNORMAL LOW (ref 39.0–52.0)
Hemoglobin: 11.8 g/dL — ABNORMAL LOW (ref 13.0–17.0)
MCH: 33.5 pg (ref 26.0–34.0)
RBC: 3.52 MIL/uL — ABNORMAL LOW (ref 4.22–5.81)

## 2011-01-22 LAB — CREATININE, SERUM: Creatinine, Ser: 0.86 mg/dL (ref 0.50–1.35)

## 2011-01-22 MED ORDER — POLYVINYL ALCOHOL 1.4 % OP SOLN
1.0000 [drp] | Freq: Four times a day (QID) | OPHTHALMIC | Status: DC
  Administered 2011-01-22 – 2011-01-24 (×9): 1 [drp] via OPHTHALMIC
  Filled 2011-01-22: qty 15

## 2011-01-22 MED ORDER — OMEGA-3 FATTY ACIDS 1000 MG PO CAPS
1.0000 g | ORAL_CAPSULE | Freq: Every day | ORAL | Status: DC
  Administered 2011-01-22: 1 g via ORAL
  Filled 2011-01-22 (×2): qty 1

## 2011-01-22 MED ORDER — ASPIRIN EC 325 MG PO TBEC
325.0000 mg | DELAYED_RELEASE_TABLET | Freq: Every day | ORAL | Status: DC
  Administered 2011-01-22 – 2011-01-24 (×3): 325 mg via ORAL
  Filled 2011-01-22 (×4): qty 1

## 2011-01-22 MED ORDER — TRAZODONE HCL 100 MG PO TABS
100.0000 mg | ORAL_TABLET | Freq: Every evening | ORAL | Status: DC | PRN
  Filled 2011-01-22: qty 1

## 2011-01-22 MED ORDER — VITAMIN D3 25 MCG (1000 UNIT) PO TABS
2000.0000 [IU] | ORAL_TABLET | Freq: Every day | ORAL | Status: DC
  Administered 2011-01-22 – 2011-01-24 (×3): 2000 [IU] via ORAL
  Filled 2011-01-22 (×4): qty 2

## 2011-01-22 MED ORDER — FLUTICASONE PROPIONATE HFA 44 MCG/ACT IN AERO
2.0000 | INHALATION_SPRAY | Freq: Two times a day (BID) | RESPIRATORY_TRACT | Status: DC
  Administered 2011-01-22 – 2011-01-24 (×4): 2 via RESPIRATORY_TRACT
  Filled 2011-01-22 (×2): qty 10.6

## 2011-01-22 MED ORDER — IPRATROPIUM BROMIDE 0.02 % IN SOLN
0.5000 mg | Freq: Four times a day (QID) | RESPIRATORY_TRACT | Status: DC
  Administered 2011-01-22 – 2011-01-24 (×8): 0.5 mg via RESPIRATORY_TRACT
  Filled 2011-01-22 (×8): qty 2.5

## 2011-01-22 MED ORDER — FLUTICASONE PROPIONATE 50 MCG/ACT NA SUSP
1.0000 | Freq: Every day | NASAL | Status: DC
  Administered 2011-01-22 – 2011-01-24 (×3): 1 via NASAL
  Filled 2011-01-22: qty 16

## 2011-01-22 MED ORDER — METOPROLOL SUCCINATE ER 100 MG PO TB24
100.0000 mg | ORAL_TABLET | Freq: Every day | ORAL | Status: DC
  Administered 2011-01-22 – 2011-01-24 (×3): 100 mg via ORAL
  Filled 2011-01-22 (×4): qty 1

## 2011-01-22 MED ORDER — LEVALBUTEROL HCL 0.63 MG/3ML IN NEBU
0.6300 mg | INHALATION_SOLUTION | Freq: Four times a day (QID) | RESPIRATORY_TRACT | Status: DC
  Administered 2011-01-23 – 2011-01-24 (×7): 0.63 mg via RESPIRATORY_TRACT
  Filled 2011-01-22 (×15): qty 3

## 2011-01-22 MED ORDER — DEXTROSE 5 % IV SOLN
500.0000 mg | INTRAVENOUS | Status: DC
  Administered 2011-01-22 – 2011-01-24 (×3): 500 mg via INTRAVENOUS
  Filled 2011-01-22 (×3): qty 500

## 2011-01-22 MED ORDER — ENOXAPARIN SODIUM 40 MG/0.4ML ~~LOC~~ SOLN
40.0000 mg | SUBCUTANEOUS | Status: DC
  Administered 2011-01-22 – 2011-01-24 (×4): 40 mg via SUBCUTANEOUS
  Filled 2011-01-22 (×4): qty 0.4

## 2011-01-22 MED ORDER — OMEGA-3-ACID ETHYL ESTERS 1 G PO CAPS
1.0000 g | ORAL_CAPSULE | Freq: Every day | ORAL | Status: DC
  Administered 2011-01-23 – 2011-01-24 (×2): 1 g via ORAL
  Filled 2011-01-22 (×3): qty 1

## 2011-01-22 MED ORDER — SODIUM CHLORIDE 0.9 % IV SOLN: 250.0000 mL | INTRAVENOUS | Status: DC | PRN

## 2011-01-22 MED ORDER — SODIUM CHLORIDE 0.9 % IJ SOLN
3.0000 mL | Freq: Two times a day (BID) | INTRAMUSCULAR | Status: DC
  Administered 2011-01-22: 3 mL via INTRAVENOUS

## 2011-01-22 MED ORDER — FUROSEMIDE 10 MG/ML IJ SOLN
40.0000 mg | Freq: Every day | INTRAMUSCULAR | Status: DC
  Administered 2011-01-22 – 2011-01-24 (×3): 40 mg via INTRAVENOUS
  Filled 2011-01-22 (×4): qty 4

## 2011-01-22 MED ORDER — PREDNISONE 20 MG PO TABS
40.0000 mg | ORAL_TABLET | Freq: Every day | ORAL | Status: AC
  Administered 2011-01-22 – 2011-01-24 (×3): 40 mg via ORAL
  Filled 2011-01-22 (×3): qty 2

## 2011-01-22 MED ORDER — MORPHINE SULFATE 2 MG/ML IJ SOLN: 2.0000 mg | INTRAMUSCULAR | Status: DC | PRN

## 2011-01-22 MED ORDER — CARBOXYMETHYLCELLULOSE SODIUM 0.25 % OP SOLN: 1.0000 [drp] | Freq: Four times a day (QID) | OPHTHALMIC | Status: DC

## 2011-01-22 MED ORDER — ROSUVASTATIN CALCIUM 20 MG PO TABS
20.0000 mg | ORAL_TABLET | Freq: Every day | ORAL | Status: DC
  Administered 2011-01-22 – 2011-01-24 (×4): 20 mg via ORAL
  Filled 2011-01-22 (×5): qty 1

## 2011-01-22 MED ORDER — PANTOPRAZOLE SODIUM 40 MG PO TBEC
40.0000 mg | DELAYED_RELEASE_TABLET | Freq: Every day | ORAL | Status: DC
  Administered 2011-01-22 – 2011-01-24 (×3): 40 mg via ORAL
  Filled 2011-01-22 (×4): qty 1

## 2011-01-22 MED ORDER — SODIUM CHLORIDE 0.9 % IJ SOLN: 3.0000 mL | INTRAMUSCULAR | Status: DC | PRN

## 2011-01-22 MED ORDER — LISINOPRIL 10 MG PO TABS
10.0000 mg | ORAL_TABLET | Freq: Every day | ORAL | Status: DC
  Administered 2011-01-22 – 2011-01-24 (×3): 10 mg via ORAL
  Filled 2011-01-22 (×4): qty 1

## 2011-01-22 MED ORDER — ALPRAZOLAM 1 MG PO TABS
1.0000 mg | ORAL_TABLET | Freq: Three times a day (TID) | ORAL | Status: DC | PRN
Start: 2011-01-22 — End: 2011-01-24
  Administered 2011-01-22 – 2011-01-24 (×5): 1 mg via ORAL
  Filled 2011-01-22 (×3): qty 1
  Filled 2011-01-22: qty 2
  Filled 2011-01-22: qty 1

## 2011-01-22 MED ORDER — CITALOPRAM HYDROBROMIDE 20 MG PO TABS
20.0000 mg | ORAL_TABLET | Freq: Every day | ORAL | Status: DC
  Administered 2011-01-22 – 2011-01-24 (×3): 20 mg via ORAL
  Filled 2011-01-22 (×4): qty 1

## 2011-01-22 MED ORDER — LEVALBUTEROL HCL 0.63 MG/3ML IN NEBU
0.6300 mg | INHALATION_SOLUTION | Freq: Four times a day (QID) | RESPIRATORY_TRACT | Status: DC | PRN
  Administered 2011-01-22 (×3): 0.63 mg via RESPIRATORY_TRACT
  Filled 2011-01-22: qty 3

## 2011-01-22 MED ORDER — HYDROXYZINE HCL 25 MG PO TABS
25.0000 mg | ORAL_TABLET | Freq: Three times a day (TID) | ORAL | Status: DC | PRN
  Administered 2011-01-22 – 2011-01-23 (×3): 25 mg via ORAL
  Filled 2011-01-22 (×3): qty 1

## 2011-01-22 MED ORDER — AMLODIPINE BESYLATE 5 MG PO TABS
5.0000 mg | ORAL_TABLET | Freq: Every day | ORAL | Status: DC
  Administered 2011-01-22 – 2011-01-24 (×3): 5 mg via ORAL
  Filled 2011-01-22 (×4): qty 1

## 2011-01-22 MED ORDER — ASPIRIN-DIPYRIDAMOLE ER 25-200 MG PO CP12
1.0000 | ORAL_CAPSULE | Freq: Two times a day (BID) | ORAL | Status: DC
  Administered 2011-01-22 – 2011-01-24 (×5): 1 via ORAL
  Filled 2011-01-22 (×9): qty 1

## 2011-01-22 MED ORDER — FUROSEMIDE 10 MG/ML IJ SOLN
40.0000 mg | Freq: Once | INTRAMUSCULAR | Status: AC
  Administered 2011-01-22: 40 mg via INTRAVENOUS
  Filled 2011-01-22: qty 4

## 2011-01-22 NOTE — ED Provider Notes (Signed)
See prior note   Ward Givens, MD 01/22/11 [phone]

## 2011-01-22 NOTE — Progress Notes (Signed)
Subjective: SOB with min activity, denies CP. Objective: Vital signs in last 24 hours: Temp:  [97.3 F (36.3 C)-97.9 F (36.6 C)] 97.3 F (36.3 C) (11/28 1327) Pulse Rate:  [55-86] 77  (11/28 1327) Resp:  [16-24] 22  (11/28 1327) BP: (114-147)/(63-109) 138/63 mmHg (11/28 1327) SpO2:  [92 %-99 %] 98 % (11/28 1447)   Intake/Output from previous day: 11/27 0701 - 11/28 0700 In: -  Out: 1625 [Urine:1625] Intake/Output this shift:      General Appearance:    Alert, cooperative, tachypneic with mild accessory muscle use  Lungs:    decreased air mov't bil.,basilar crackles>on left, no wheezes.     Heart:    Regular rate and rhythm, S1 and S2 normal, no murmur, rub   or gallop  Abdomen:     Soft, non-tender, bowel sounds active all four quadrants,    no masses, no organomegaly  Extremities:   no cyanosis or edema  Neurologic:   CNII-XII intact, normal strength       Weight change:   Intake/Output Summary (Last 24 hours) at 01/22/11 1530 Last data filed at 01/22/11 1610  Gross per 24 hour  Intake      0 ml  Output   1625 ml  Net  -1625 ml    Lab Results:   [facility] 01/22/11 0450 01/21/11 1820  NA 138 139  K 3.6 3.7  CL 102 104  CO2 24 23  GLUCOSE 211* 134*  BUN 8 7  CREATININE 0.85 0.83  CALCIUM 9.1 9.7    Basename 01/21/11 1820  WBC 7.3  HGB 12.8*  HCT 36.3*  PLT 214  MCV 96.5   PT/INR No results found for this basename: LABPROT:2,INR:2 in the last 72 hours ABG No results found for this basename: PHART:2,PCO2:2,PO2:2,HCO3:2 in the last 72 hours  Micro Results: No results found for this or any previous visit (from the past 240 hour(s)). Studies/Results: Dg Chest 2 View  01/21/2011  *RADIOLOGY REPORT*  Clinical Data: Congestive heart failure, short of breath  CHEST - 2 VIEW  Comparison: None.  Findings: Normal mediastinum and heart silhouette.  There are bilateral pleural effusions which are increased compared to prior. There is chronic bronchitic  markings centrally.  Lungs are hyperinflated.  No acute osseous abnormality.  IMPRESSION:  1.  Increase in small bilateral pleural effusions. 2.  Emphysematous change .  Original Report Authenticated By: Genevive Bi, M.D.   Medications: Scheduled Meds:   . albuterol  5 mg Nebulization Once  . albuterol  5 mg Nebulization Once  . ALPRAZolam  1 mg Oral Once  . amLODipine  5 mg Oral Daily  . aspirin  325 mg Oral Daily  . azithromycin (ZITHROMAX) 500 MG IVPB  500 mg Intravenous Once  . azithromycin  500 mg Intravenous Q24H  . cholecalciferol  2,000 Units Oral Daily  . citalopram  20 mg Oral Daily  . dipyridamole-aspirin  1 capsule Oral BID  . fish oil-omega-3 fatty acids  1 g Oral Daily  . fluticasone  1 spray Each Nare Daily  . fluticasone  2 puff Inhalation BID  . furosemide  20 mg Intravenous Once  . furosemide  40 mg Intravenous Daily  . furosemide  40 mg Intravenous Once  . ipratropium  0.5 mg Nebulization Once  . lisinopril  10 mg Oral Daily  . methylPREDNISolone (SOLU-MEDROL) injection  125 mg Intravenous Once  . metoprolol  100 mg Oral Daily  . nitroGLYCERIN  1 inch Topical Once  .  pantoprazole  40 mg Oral Q1200  . polyvinyl alcohol  1 drop Both Eyes QID  . predniSONE  40 mg Oral QAC breakfast  . rosuvastatin  20 mg Oral q1800  . DISCONTD: Carboxymethylcellulose Sodium  1 drop Both Eyes QID   Continuous Infusions:  PRN Meds:.ALPRAZolam, hydrOXYzine, levalbuterol, traZODone Assessment/Plan:  Present on Admission:  .Systolic CHF, acute on chronic - Continue diuresis with iv lasix, s/p echo on 11/8 with EF 35-40% - also continue b-blocker, and ACEI, cardiac enzymes so far neg, follow consider cards consult pending enzymes. He is followed by cards at [facility] .Ischemic cardiomyopathy - continue oupt meds .COPD (chronic obstructive pulmonary disease) -continue nebs, abx and prednisone .HTN (hypertension)   LOS: 1 day   Dietrich Samuelson C 01/22/2011, 3:30 PM

## 2011-01-22 NOTE — ED Provider Notes (Signed)
See prior note   Tamarah Bhullar L Mahkayla Preece, MD 01/22/11 0017 

## 2011-01-22 NOTE — Progress Notes (Signed)
CARE MANAGEMENT NOTE 01/22/2011  Patient:  Jeffrey Melendez, Jeffrey Melendez   Account Number:  [id number]  Date Initiated:  01/22/2011  Documentation initiated by:  DAVIS,RHONDA  Subjective/Objective Assessment:   pt with copd and chf recent ej of 35%, admitted with dyspnea and respiratory wheezes     Action/Plan:   [facility]   Anticipated DC Date:  01/25/2011   Anticipated DC Plan:  HOME/SELF CARE  In-house referral  NA      DC Planning Services  NA      [facility] Choice  NA   Choice offered to / List presented to:  NA   DME arranged  NA      DME agency  NA     HH arranged  NA      HH agency  NA   Status of service:  In process, will continue to follow Medicare Important Message given?   (If response is "NO", the following Medicare IM given date fields will be blank) Date Medicare IM given:   Date Additional Medicare IM given:    Discharge Disposition:    Per UR Regulation:  Reviewed for med. necessity/level of care/duration of stay  Comments:  [id number]/Rhonda Earlene Plater, RN, BSN, CCM/CHART REVIEW FOR UR PERFORMED.

## 2011-01-22 NOTE — ED Notes (Signed)
Pt. Tolerating meal well. With some SOB

## 2011-01-23 LAB — BASIC METABOLIC PANEL
CO2: 28 mEq/L (ref 19–32)
Chloride: 94 mEq/L — ABNORMAL LOW (ref 96–112)
GFR calc Af Amer: >90 mL/min (ref >90)
Potassium: 3.4 mEq/L — ABNORMAL LOW (ref 3.5–5.1)

## 2011-01-23 LAB — PRO B NATRIURETIC PEPTIDE: Pro B Natriuretic peptide (BNP): 1063 pg/mL — ABNORMAL HIGH (ref 0–125)

## 2011-01-23 LAB — CBC
Hemoglobin: 11.8 g/dL — ABNORMAL LOW (ref 13.0–17.0)
MCV: 97.2 fL (ref 78.0–100.0)
Platelets: 205 10*3/uL (ref 150–400)
RBC: 3.51 MIL/uL — ABNORMAL LOW (ref 4.22–5.81)
WBC: 12.2 10*3/uL — ABNORMAL HIGH (ref 4.0–10.5)

## 2011-01-23 MED ORDER — POTASSIUM CHLORIDE CRYS ER 20 MEQ PO TBCR
40.0000 meq | EXTENDED_RELEASE_TABLET | Freq: Once | ORAL | Status: AC
  Administered 2011-01-23: 40 meq via ORAL
  Filled 2011-01-23: qty 2

## 2011-01-23 MED ORDER — FUROSEMIDE 10 MG/ML IJ SOLN
40.0000 mg | Freq: Once | INTRAMUSCULAR | Status: AC
  Administered 2011-01-23: 40 mg via INTRAVENOUS
  Filled 2011-01-23: qty 4

## 2011-01-23 NOTE — Progress Notes (Signed)
Inpatient Diabetes Program Recommendations  AACE/ADA: New Consensus Statement on Inpatient Glycemic Control (2009)  Target Ranges:  Prepandial:   less than 140 mg/dL      Peak postprandial:   less than 180 mg/dL (1-2 hours)      Critically ill patients:  140 - 180 mg/dL   Reason for Visit: Hyperglycemia  Inpatient Diabetes Program Recommendations Correction (SSI): Add Novolog sensitive tidwc while on steroids  Note: HgbA1C - 6.3%

## 2011-01-23 NOTE — Progress Notes (Signed)
Inpatient Diabetes Program Recommendations  AACE/ADA: New Consensus Statement on Inpatient Glycemic Control (2009)  Target Ranges:  Prepandial:   less than 140 mg/dL      Peak postprandial:   less than 180 mg/dL (1-2 hours)      Critically ill patients:  140 - 180 mg/dL   [birth year] Reason for Visit: Hyperglycemia and elevated HgBA1C with no documented diagnosis of diabetes.  Inpatient Diabetes Program Recommendations Correction (SSI): add sensitive correctin Novolog three times daily with meals while on Prednisone. HgbA1C: A1C at 6.3%.  According to AACE and ADA, pt has diagnosis of diabetes based on this value. Most probably due to steroid therapy in the past and prsent. t would do well to begin diet therapy with carb modified diet. Diet: Please change to carb modified-medium to assist with glucose control.  Please discuss the A1C results with patient.  Note: Thank you, Lenor Coffin, RN, CNS, Diabetes Coordinator [phone])

## 2011-01-23 NOTE — Progress Notes (Signed)
Occupational Therapy Evaluation Patient Details Name: Jeffrey Melendez MRN: [id number] DOB: Aug 22, 1945 Today's Date: 01/23/2011  Problem List:  Patient Active Problem List  Diagnoses  . Systolic CHF, acute on chronic  . COPD (chronic obstructive pulmonary disease)  . CAD (coronary artery disease)  . Hyperlipidemia  . Tobacco abuse  . HTN (hypertension)  . Ischemic cardiomyopathy  . Peripheral vascular disease  . Cerebrovascular disease  . Hyperglycemia  . Atrial fibrillation  . Acute renal failure  . Dizziness - light-headed    Past Medical History:  Past Medical History  Diagnosis Date  . COPD (chronic obstructive pulmonary disease)   . Myocardial infarction   . Stroke   . PAD (peripheral artery disease)   . CHF (congestive heart failure)   . Hyperlipidemia   . Anxiety   . Ischemic cardiomyopathy    Past Surgical History:  Past Surgical History  Procedure Date  . Coronary stent placement     x 5  . Arm surgery   . Foot surgery   . Head surgery   . Facial fracture surgery   . Knee surgery   . Cardiac catheterization 09/2008  . Skin graft to right arm     OT Assessment/Plan/Recommendation OT Assessment Clinical Impression Statement: Pt demos decline in function with strength, activity tolerance, balance and safety for ADLS and ADL mobility. Pt would benefit from skilled OT services to increase independence and maximize level of function to return home safely OT Recommendation/Assessment: Patient will need skilled OT in the acute care venue OT Problem List: Decreased strength;Decreased activity tolerance;Decreased safety awareness;Decreased knowledge of use of DME or AE;Impaired balance (sitting and/or standing);Decreased knowledge of precautions Problem List Comments: O2 SATs decreased to 88% with acitivity, labored breathing. Pt able to recover to 96% O2 after returning to sitting position and performing pursed lip breathing. Pt and family educated on pursed lip  breathing and energy conservation techniques Barriers to Discharge: None OT Therapy Diagnosis : Generalized weakness OT Plan OT Frequency: Min 2X/week OT Treatment/Interventions: Self-care/ADL training;Therapeutic activities;Therapeutic exercise;Neuromuscular education;Energy conservation;DME and/or AE instruction;Patient/family education;Balance training OT Recommendation Follow Up Recommendations: Home health OT Equipment Recommended: Tub/shower bench Individuals Consulted Consulted and Agree with Results and Recommendations: Patient;Family member/caregiver Family Member Consulted: Pt's daughter OT Goals Acute Rehab OT Goals OT Goal Formulation: With patient Time For Goal Achievement: 7 days ADL Goals Pt Will Perform Grooming: with supervision;Standing at sink Pt Will Perform Lower Body Bathing: with supervision;with set-up Pt Will Perform Lower Body Dressing: with set-up;with supervision Pt Will Transfer to Toilet: with supervision;Grab bars Pt Will Perform Toileting - Clothing Manipulation: with supervision Pt Will Perform Tub/Shower Transfer: with supervision;with DME;Grab bars Additional ADL Goal #1: Pt will perform 15 minutes of therapetuic acitvities with 2 rest breaks demonstrating 90% or higher O2 SATS Additional ADL Goal #2: Pt will demo 119% competency with pursed lip breathing and energy conservation techniques during and after activity  OT Evaluation Precautions/Restrictions  Precautions Precautions: Fall Precaution Comments: LOB during dynamic standing tasks in bathroom shower requiring min guard assist to correct Required Braces or Orthoses: No Restrictions Weight Bearing Restrictions: No Prior Functioning Home Living Lives With: Significant other Receives Help From: Other (Comment) (significant other) Type of Home: Apartment Home Layout: One level Home Access: Level entry Bathroom Shower/Tub:  (EDUCATED PT/FAMILY ON USE OF TUB BENCH AT HOME FOR  SAFETY) Bathroom Toilet: Standard Bathroom Accessibility: Yes How Accessible: Accessible via walker Home Adaptive Equipment: Walker - rolling;Straight cane Additional Comments: Pt states he needs  tennis ball/slides for rear legs of walker - pt has been unable to use RW at home b/c of this. Prior Function Level of Independence: Independent with basic ADLs;Needs assistance with homemaking;Requires assistive device for independence Driving: No Vocation: Retired ADL ADL Eating/Feeding: Performed;Independent Where Assessed - Eating/Feeding: Chair Grooming: Performed;Wash/dry hands;Wash/dry face;Brushing hair;Set up (Min guard assist) Grooming Details (indicate cue type and reason): verbal cues for safety due to decreased dynmamic standing balance at sink Where Assessed - Grooming: Standing at sink Upper Body Bathing: Simulated;Supervision/safety;Set up Where Assessed - Upper Body Bathing: Standing at sink;Shower;Sit to stand in shower Lower Body Bathing: Simulated;Minimal assistance Where Assessed - Lower Body Bathing: Shower;Sit to stand in shower Upper Body Dressing: Performed;Supervision/safety;Set up Lower Body Dressing: Supervision/safety;Set up Lower Body Dressing Details (indicate cue type and reason): donning socks Where Assessed - Lower Body Dressing: Sitting, chair Toilet Transfer: Minimal assistance Toilet Transfer Method: Ambulating;Other (comment) (Using [facility]) Toilet Transfer Equipment: Grab bars;Regular height toilet Toileting - Clothing Manipulation: Minimal assistance Where Assessed - Glass blower/designer Manipulation: Standing Toileting - Hygiene: Performed;Supervision/safety Where Assessed - Toileting Hygiene: Not assessed Tub/Shower Transfer: Performed;Minimal assistance Tub/Shower Transfer Details (indicate cue type and reason): LOB requirnf min guard assist to correct Tub/Shower Transfer Method: Science writer: Grab bars;Walk in shower;Shower  seat without back Equipment Used: Cane Vision/Perception  Vision - History Baseline Vision: Wears glasses all the time Perception Perception: Within Functional Limits Cognition Cognition Arousal/Alertness: Awake/alert Overall Cognitive Status: Appears within functional limits for tasks assessed Orientation Level: Oriented X4 Sensation/Coordination Sensation Light Touch: Appears Intact Coordination Gross Motor Movements are Fluid and Coordinated: Yes Fine Motor Movements are Fluid and Coordinated: Yes Extremity Assessment RUE Assessment RUE Assessment: Within Functional Limits (MMT 4-/5) LUE Assessment LUE Assessment:  (MMT 3+/5) Mobility  Bed Mobility Bed Mobility: No Transfers Transfers: Yes Sit to Stand: 4: Min assist Sit to Stand Details (indicate cue type and reason): verbal cues for safety with correrct hand placement from chair, toilet and shower Stand to Sit: 4: Min assist Stand to Sit Details: verbal cues for safety   End of Session OT - End of Session Equipment Utilized During Treatment: Gait belt;Other (comment) (SPC), 3 in 1 for shower transfer Activity Tolerance: Patient limited by fatigue;Other (comment) (O2 SATS dropped to 88% during activity, labored breathing) Patient left: in chair;with call bell in reach;with family/visitor present General Behavior During Session: [facility] for tasks performed Cognition: [facility] for tasks performed   Galen Manila 01/23/2011, 2:27 PM

## 2011-01-23 NOTE — Plan of Care (Signed)
Problem: Phase II Progression Outcomes Goal: Discharge plan established Recommend HH OT eval for ADL safety and tub bench for home use after acute discharge

## 2011-01-23 NOTE — Progress Notes (Signed)
Subjective: states breathing better but still short of breath with minimal activity. Denies chest pain.. Objective: Vital signs in last 24 hours: Temp:  [97.3 F (36.3 C)-97.9 F (36.6 C)] 97.6 F (36.4 C) (11/29 0457) Pulse Rate:  [68-87] 68  (11/29 0457) Resp:  [18-22] 18  (11/29 0457) BP: (120-146)/(63-79) 127/79 mmHg (11/29 0457) SpO2:  [93 %-98 %] 95 % (11/29 0847) Weight:  [73.8 kg (162 lb 11.2 oz)-75.411 kg (166 lb 4 oz)] 162 lb 11.2 oz (73.8 kg) (11/29 0457) Last BM Date: 01/21/11 Intake/Output from previous day: 11/28 0701 - 11/29 0700 In: 480 [P.O.:480] Out: 1750 [Urine:1750] Intake/Output this shift: Total I/O In: 250 [IV Piggyback:250] Out: -     General Appearance:    Alert, cooperative, tachypneic with mild accessory muscle use  Lungs:    decreased air mov't bil., crackles or wheezes.Marland Kitchen     Heart:    Regular rate and rhythm, S1 and S2 normal, no murmur, rub   or gallop  Abdomen:     Soft, non-tender, bowel sounds active all four quadrants,    no masses, no organomegaly  Extremities:   no cyanosis or edema  Neurologic:   CNII-XII intact, normal strength       Weight change:   Intake/Output Summary (Last 24 hours) at 01/23/11 0940 Last data filed at 01/23/11 0754  Gross per 24 hour  Intake    730 ml  Output   1750 ml  Net  -1020 ml    Lab Results:   Basename 01/23/11 0440 01/22/11 1300 01/22/11 0450  NA 133* -- 138  K 3.4* -- 3.6  CL 94* -- 102  CO2 28 -- 24  GLUCOSE 172* -- 211*  BUN 17 -- 8  CREATININE 0.86 0.86 --  CALCIUM 9.9 -- 9.1    Basename 01/23/11 0440 01/22/11 1300  WBC 12.2* 10.0  HGB 11.8* 11.8*  HCT 34.1* 33.9*  PLT 205 203  MCV 97.2 96.3   PT/INR No results found for this basename: LABPROT:2,INR:2 in the last 72 hours ABG No results found for this basename: PHART:2,PCO2:2,PO2:2,HCO3:2 in the last 72 hours  Micro Results: No results found for this or any previous visit (from the past 240 hour(s)). Studies/Results: Dg  Chest 2 View  01/21/2011  *RADIOLOGY REPORT*  Clinical Data: Congestive heart failure, short of breath  CHEST - 2 VIEW  Comparison: None.  Findings: Normal mediastinum and heart silhouette.  There are bilateral pleural effusions which are increased compared to prior. There is chronic bronchitic markings centrally.  Lungs are hyperinflated.  No acute osseous abnormality.  IMPRESSION:  1.  Increase in small bilateral pleural effusions. 2.  Emphysematous change .  Original Report Authenticated By: Genevive Bi, M.D.   Medications: Scheduled Meds:    . amLODipine  5 mg Oral Daily  . aspirin  325 mg Oral Daily  . azithromycin  500 mg Intravenous Q24H  . cholecalciferol  2,000 Units Oral Daily  . citalopram  20 mg Oral Daily  . dipyridamole-aspirin  1 capsule Oral BID  . enoxaparin  40 mg Subcutaneous Q24H  . fluticasone  1 spray Each Nare Daily  . fluticasone  2 puff Inhalation BID  . furosemide  40 mg Intravenous Daily  . furosemide  40 mg Intravenous Once  . ipratropium  0.5 mg Nebulization Q6H  . levalbuterol  0.63 mg Nebulization Q6H  . lisinopril  10 mg Oral Daily  . metoprolol  100 mg Oral Daily  . omega-3 acid ethyl  esters  1 g Oral Daily  . pantoprazole  40 mg Oral Q1200  . polyvinyl alcohol  1 drop Both Eyes QID  . potassium chloride  40 mEq Oral Once  . predniSONE  40 mg Oral QAC breakfast  . rosuvastatin  20 mg Oral q1800  . DISCONTD: fish oil-omega-3 fatty acids  1 g Oral Daily  . DISCONTD: sodium chloride  3 mL Intravenous Q12H   Continuous Infusions:  PRN Meds:.ALPRAZolam, hydrOXYzine, levalbuterol, morphine, traZODone, DISCONTD: sodium chloride, DISCONTD: sodium chloride Assessment/Plan:  Present on Admission:  .Systolic CHF, acute on chronic  - Continue diuresis with iv lasix, s/p echo on 11/8 with EF 35-40%, BNP trending down - also continue b-blocker, and ACEI, cardiac enzymes are negative, he chest pain free.Marland Kitchen He is followed by cards at [facility] .Ischemic  cardiomyopathy - continue oupt meds, cardiac enzymes negative.  Marland KitchenCOPD (chronic obstructive pulmonary disease) -continue nebs, abx and prednisone slowly improving .HTN (hypertension)- controlled on outpatient medications.   LOS: 2 days   Caleel Kiner C 01/23/2011, 9:40 AM

## 2011-01-23 NOTE — Progress Notes (Signed)
Physical Therapy Evaluation Patient Details Name: Jeffrey Melendez MRN: [id number] DOB: [birth year] Today's Date: 01/23/2011 Time: 1191-4782  Eval II Problem List:  Patient Active Problem List  Diagnoses  . Systolic CHF, acute on chronic  . COPD (chronic obstructive pulmonary disease)  . CAD (coronary artery disease)  . Hyperlipidemia  . Tobacco abuse  . HTN (hypertension)  . Ischemic cardiomyopathy  . Peripheral vascular disease  . Cerebrovascular disease  . Hyperglycemia  . Atrial fibrillation  . Acute renal failure  . Dizziness - light-headed    Past Medical History:  Past Medical History  Diagnosis Date  . COPD (chronic obstructive pulmonary disease)   . Myocardial infarction   . Stroke   . PAD (peripheral artery disease)   . CHF (congestive heart failure)   . Hyperlipidemia   . Anxiety   . Ischemic cardiomyopathy    Past Surgical History:  Past Surgical History  Procedure Date  . Coronary stent placement     x 5  . Arm surgery   . Foot surgery   . Head surgery   . Facial fracture surgery   . Knee surgery   . Cardiac catheterization 09/2008  . Skin graft to right arm     PT Assessment/Plan/Recommendation PT Assessment Clinical Impression Statement: Pt presents with diagnosis of COPD exacerbation. Pt will benefit from skilled PT in the acute care setting to maximize independence and safety with basic funcitonal mobility in preperation for DC home. PT Recommendation/Assessment: Patient will need skilled PT in the acute care venue PT Problem List: Decreased balance;Decreased activity tolerance;Decreased mobility PT Therapy Diagnosis : Difficulty walking PT Plan PT Frequency: Min 3X/week PT Treatment/Interventions: Gait training;DME instruction;Functional mobility training;Balance training;Patient/family education PT Recommendation Recommendations for Other Services: OT consult Follow Up Recommendations: Home health PT. Pt states he has been having difficulty  with scheduling HHPT visits( agreeing on times/days) Equipment Recommended: Recommend slides/tennis balls for rear legs of RW for home PT Goals  Acute Rehab PT Goals PT Goal Formulation: With patient Pt will Transfer Sit to Stand/Stand to Sit: with supervision Pt will Transfer Bed to Chair/Chair to Bed: with supervision Pt will Ambulate: 16 - 50 feet;with supervision;with least restrictive assistive device   PT Evaluation Precautions/Restrictions  Precautions Precautions: Fall Restrictions Weight Bearing Restrictions: No Prior Functioning  Home Living Lives With: Significant other-girlfriend Receives Help From: Other (Comment) (significant other) Type of Home: Apartment Home Layout: One level Home Access: Level entry Bathroom Shower/Tub: Engineer, manufacturing systems: Standard Home Adaptive Equipment: Straight cane;Walker - rolling Additional Comments: Pt states he needs tennis ball/slides for rear legs of walker - pt has been unable to use RW at home b/c of this. Prior Function Level of Independence: Requires assistive device for independence;Needs assistance with ADLs;Needs assistance with homemaking;Other (comment) (pt reports frequent falls) Cognition Cognition Arousal/Alertness: Awake/alert Overall Cognitive Status: Appears within functional limits for tasks assessed Orientation Level: Oriented X4 Sensation/Coordination Coordination Gross Motor Movements are Fluid and Coordinated: Not tested Extremity Assessment  L UE Assessment: Pt reports and is noted to have chronically subluxed shoulder. RLE Assessment RLE Assessment: Not tested (Strength > or = 4/5 with functional activity) LLE Assessment LLE Assessment: Not tested (Strength > or = 4/5 with functional activity) Mobility (including Balance) Bed Mobility Bed Mobility: Yes Supine to Sit: 6: Modified independent (Device/Increase time) Transfers Transfers: Yes Sit to Stand: 4: Min assist;From bed Sit to Stand  Details (indicate cue type and reason): VCs safety, hand placement. Assist to steady. Stand to Sit:  With upper extremity assist;To chair/3-in-1 (Min-guard assist) Stand Pivot Transfers: 4: Min assist Stand Pivot Transfer Details (indicate cue type and reason): VCs safety. Assist to steady. Used cane. Ambulation/Gait Ambulation/Gait: No (need updated activty level order-will write sticky note)  Posture/Postural Control Posture/Postural Control: No significant limitations Balance Balance Assessed: No Exercise    End of Session PT - End of Session Activity Tolerance: Patient tolerated treatment well Patient left: in chair;with call bell in reach General Behavior During Session: [facility] for tasks performed Cognition: [facility] for tasks performed  Rebeca Alert [facility] 01/23/2011, 10:53 AM

## 2011-01-24 LAB — BASIC METABOLIC PANEL
CO2: 31 mEq/L (ref 19–32)
Chloride: 95 mEq/L — ABNORMAL LOW (ref 96–112)
Sodium: 133 mEq/L — ABNORMAL LOW (ref 135–145)

## 2011-01-24 LAB — PRO B NATRIURETIC PEPTIDE: Pro B Natriuretic peptide (BNP): 436 pg/mL — ABNORMAL HIGH (ref 0–125)

## 2011-01-24 MED ORDER — POTASSIUM CHLORIDE ER 10 MEQ PO TBCR: 20.0000 meq | EXTENDED_RELEASE_TABLET | Freq: Every day | ORAL | Status: DC

## 2011-01-24 MED ORDER — AZITHROMYCIN 250 MG PO TABS: ORAL_TABLET | ORAL | Status: AC

## 2011-01-24 MED ORDER — FUROSEMIDE 40 MG PO TABS: 40.0000 mg | ORAL_TABLET | Freq: Every day | ORAL | Status: DC

## 2011-01-24 NOTE — Progress Notes (Signed)
Patient ambulated in hallway on room air assisted by NT and cane on room air, o2 saturation remained 90-92%, patient tolerated well

## 2011-01-24 NOTE — Discharge Summary (Signed)
Name: Jeffrey Melendez MRN: [id number] DOB: 1945-06-28 65 y.o.  Date of Admission: 01/21/2011  4:19 PM Date of Discharge: 01/24/2011 Attending Physician: No att. providers found  Discharge Diagnosis: Principal Problem:  *COPD (chronic obstructive pulmonary disease)  Systolic CHF, acute on chronic  HTN (hypertension)  Ischemic cardiomyopathy History of CVA History of MI History of peripheral artery disease History of anxiety History of hyperlipidemia Hypokalemia  Discharge Medications: Current Discharge Medication List    START taking these medications   Details  azithromycin (ZITHROMAX) 250 MG tablet Take 1 tablet daily.   for 3 more days Qty: 3 each, Refills: 0    furosemide (LASIX) 40 MG tablet Take 1 tablet (40 mg total) by mouth daily. Qty: 30 tablet, Refills: 1    potassium chloride (K-DUR) 10 MEQ tablet Take 2 tablets (20 mEq total) by mouth daily. Qty: 60 tablet, Refills: 0      CONTINUE these medications which have NOT CHANGED   Details  ALPRAZolam (XANAX) 1 MG tablet Take 1 mg by mouth 3 (three) times daily as needed. For anxiety     amLODipine (NORVASC) 10 MG tablet Take 5 mg by mouth daily.      aspirin 325 MG EC tablet Take 325 mg by mouth daily.      Carboxymethylcellulose Sodium 0.25 % SOLN Place 1 drop into both eyes 4 (four) times daily.      Cholecalciferol 2000 UNITS TABS Take 1 tablet by mouth daily.      citalopram (CELEXA) 40 MG tablet Take 20 mg by mouth daily.      dipyridamole-aspirin (AGGRENOX) 25-200 MG per 12 hr capsule Take 1 capsule by mouth 2 (two) times daily.      fish oil-omega-3 fatty acids 1000 MG capsule Take 1 g by mouth daily.      FLUNISOLIDE, NASAL, NA Place 2 sprays into both nostrils 2 (two) times daily as needed. For allergies     hydrOXYzine (ATARAX/VISTARIL) 25 MG tablet Take 25 mg by mouth 3 (three) times daily as needed. For anxiety      levalbuterol (XOPENEX HFA) 45 MCG/ACT inhaler Inhale 2 puffs into the lungs  every 6 (six) hours as needed for wheezing or shortness of breath. Qty: 1 Inhaler, Refills: 3    levalbuterol (XOPENEX) 0.63 MG/3ML nebulizer solution Take 3 mLs (0.63 mg total) by nebulization every 6 (six) hours as needed for wheezing or shortness of breath. Qty: 3 mL, Refills: 6    lisinopril (PRINIVIL,ZESTRIL) 10 MG tablet Take 10 mg by mouth daily.      metoprolol (TOPROL-XL) 100 MG 24 hr tablet Take 100 mg by mouth daily.      mometasone (ASMANEX) 220 MCG/INH inhaler Inhale 2 puffs into the lungs 2 (two) times daily.      Multiple Vitamin (MULTIVITAMIN) tablet Take 1 tablet by mouth daily.      nitroGLYCERIN (NITROSTAT) 0.4 MG SL tablet Place 0.4 mg under the tongue every 5 (five) minutes as needed. For chest pain     omeprazole (PRILOSEC) 20 MG capsule Take 20 mg by mouth daily.      rosuvastatin (CRESTOR) 40 MG tablet Take 20 mg by mouth daily.      traZODone (DESYREL) 100 MG tablet Take 100 mg by mouth at bedtime as needed. For sleep       STOP taking these medications     meloxicam (MOBIC) 15 MG tablet         Disposition and follow-up:   Mr.Jeffrey Melendez was discharged from [facility] in improved/stable condition.    Follow-up Appointments: #1PCP at [facility] in [facility] in one week, call for appointment  #2 cardiologist at the [facility], call appointment upon discharge the  Consultations: None  Procedures Performed:  Dg Chest 2 View  01/21/2011  *RADIOLOGY REPORT*  Clinical Data: Congestive heart failure, short of breath  CHEST - 2 VIEW  Comparison: None.  Findings: Normal mediastinum and heart silhouette.  There are bilateral pleural effusions which are increased compared to prior. There is chronic bronchitic markings centrally.  Lungs are hyperinflated.  No acute osseous abnormality.  IMPRESSION:  1.  Increase in small bilateral pleural effusions. 2.  Emphysematous change .  Original Report Authenticated By: Jeffrey Melendez, M.D.   Ct Angio Chest W/cm  &/or Wo Cm  01/01/2011  *RADIOLOGY REPORT*  Clinical Data:  Shortness of breath x1 week, cough, COPD, evaluate for PE  CT ANGIOGRAPHY CHEST WITH CONTRAST  Technique:  Multidetector CT imaging of the chest was performed using the standard protocol during bolus administration of intravenous contrast.  Multiplanar CT image reconstructions including MIPs were obtained to evaluate the vascular anatomy.  Contrast: 80mL OMNIPAQUE IOHEXOL 350 MG/ML IV SOLN  Comparison:  Chest radiograph dated 01/01/2011.  [facility] CT chest dated 06/06/2009.  Findings:  No evidence of pulmonary embolism.  Moderate paraseptal/centrilobular emphysematous changes.  No superimposed opacities suspicious for pneumonia or interstitial edema.  No suspicious pulmonary nodules.  Small bilateral pleural effusions with associated lower lobe atelectasis.  No pneumothorax.  Visualized thyroid is unremarkable.  The heart is top normal in size.  No pericardial effusion. Coronary atherosclerosis.  Atherosclerotic calcifications of the aortic arch.  Visualized upper abdomen is unremarkable.  Visualized osseous structures are within normal limits.  Review of the MIP images confirms the above findings.  IMPRESSION: No evidence of pulmonary embolism.  Moderate paraseptal/centrilobular emphysematous changes.  Small bilateral pleural effusions with associated lower lobe atelectasis.  Original Report Authenticated By: Jeffrey Melendez, M.D.   Dg Chest Portable 1 View  01/03/2011  *RADIOLOGY REPORT*  Clinical Data: Evaluate congestive heart failure  PORTABLE CHEST - 1 VIEW  Comparison: 01/01/2011; 06/03/2009; chest CT - 01/01/2011  Findings: Grossly unchanged cardiac silhouette and mediastinal contours. Grossly unchanged increased conspicuity of the pulmonary interstitium compatible with known extensive underlying centrilobular emphysematous changes as demonstrated on recent chest CT.  Grossly unchanged bibasilar heterogeneous opacities.  No definite pleural  effusion or pneumothorax.  Grossly unchanged bones.  IMPRESSION: Marked centrilobular emphysematous change without definite superimposed acute cardiopulmonary process.  Original Report Authenticated By: Waynard Reeds, M.D.   Dg Chest Portable 1 View  01/01/2011  *RADIOLOGY REPORT*  Clinical Data: Shortness of breath  PORTABLE CHEST - 1 VIEW  Comparison: 06/03/2009  Findings: Mild diffuse interstitial edema or infiltrates bilaterally.  No definite effusion.  Mild cardiomegaly. Atheromatous aortic arch.  Regional bones unremarkable.  IMPRESSION:  1.  Mild cardiomegaly with new diffuse interstitial edema or infiltrates.  Original Report Authenticated By: Osa Craver, M.D.   Brief History A 65 year old white male with history for emphysema, ischemic cardiomyopathy, MI in the past,  and congestive heart failure with recent ejection fraction of 35% during his last hospitalization without complaints of worsening shortness of breath. The patient had a chest x-ray done which showed mild CHF but no infiltrates, and increased bilateral effusions are noted. His cardiac enzymes negative and his white cell count within normal limits. The ED he  was nebulized bronchodilators and given a dose of Lasix and admitted to the hospitalist service for evaluation and management.  Physical Exam General Appearance:  Alert, cooperative, non labored respiration  Lungs:   moderate air movement,  No crackles or wheezes.Marland Kitchen   Heart:  Regular rate and rhythm, S1 and S2 normal, no murmur, rub  or gallop   Abdomen:  Soft, non-tender, bowel sounds active all four quadrants,  no masses, no organomegaly   Extremities:  no cyanosis or edema   Neurologic:  CNII-XII intact, normal strength      Hospital Course by problem list: Principal Problem:  *COPD (chronic obstructive pulmonary disease) exacerbation Upon admission patient was placed on nebulized bronchodilators as well as supplemental oxygen and on antibiotics. He was  also placed on inhaled steroids. He responded well to this interventions and his shortness of breath resolved and his O2 sats -ambulatory on room air today is 91%. He does not meet our criteria for home O2. He is medically stable for discharge at this time and is to follow up outpatient with his primary care physician - at the [place]. Active Problems:  Systolic CHF, acute on chronic Upon admission the patient was started on IV Lasix, his  brain natruretic peptide was elevated, it was noted that he had had a recent echocardiogram which showed an ejection fraction of 35% at the. Cardiac enzymes were cycled and came back negative he was maintained on his ACE inhibitor his and beta blockers. He diuresed well on the Lasix and his arm brain natruretic peptide improved to 436 sfrom 1147 on admission, as well as his symptoms. He'll be discharged on oral Lasix at this time and is to follow up outpatient at  HTN (hypertension)- his blood pressure was controlled on his outpatient medications and his to continue them upon discharge  Ischemic cardiomyopathy-patient was maintained on his outpatient medications and his to follow up at the [facility] with cardiology. His cardiac enzymes were negative during this hospital stay, and he was chest pain-free. Hypokalemia-his potassium was replaced during this hospital stay   Discharge Vitals:  BP 97/62  Pulse 65  Temp(Src) 98 F (36.7 C) (Oral)  Resp 18  Ht 5\' 7"  (1.702 m)  Wt 73.4 kg (161 lb 13.1 oz)  BMI 25.34 kg/m2  SpO2 94%  Discharge Labs:  Results for orders placed during the hospital encounter of 01/21/11 (from the past 24 hour(s))  BASIC METABOLIC PANEL     Status: Abnormal   Collection Time   01/24/11  4:17 AM      Component Value Range   Sodium 133 (*) 135 - 145 (mEq/L)   Potassium 3.4 (*) 3.5 - 5.1 (mEq/L)   Chloride 95 (*) 96 - 112 (mEq/L)   CO2 31  19 - 32 (mEq/L)   Glucose, Bld 266 (*) 70 - 99 (mg/dL)   BUN 24 (*) 6 - 23 (mg/dL)   Creatinine, Ser 9.14   0.50 - 1.35 (mg/dL)   Calcium 9.7  8.4 - 78.2 (mg/dL)   GFR calc non Af Amer 69 (*) >90 (mL/min)   GFR calc Af Amer 80 (*) >90 (mL/min)  PRO B NATRIURETIC PEPTIDE     Status: Abnormal   Collection Time   01/24/11  4:17 AM      Component Value Range   BNP, POC 436.0 (*) 0 - 125 (pg/mL)    Signed: Kela Millin 01/24/2011, 3:30 PM

## 2013-10-08 ENCOUNTER — Emergency Department (HOSPITAL_BASED_OUTPATIENT_CLINIC_OR_DEPARTMENT_OTHER): Payer: Medicare HMO

## 2013-10-08 ENCOUNTER — Observation Stay (HOSPITAL_BASED_OUTPATIENT_CLINIC_OR_DEPARTMENT_OTHER)
Admission: EM | Admit: 2013-10-08 | Discharge: 2013-10-11 | Disposition: A | Discharge status: Home / Self Care | Payer: Medicare HMO | Attending: Internal Medicine | Admitting: Internal Medicine

## 2013-10-08 ENCOUNTER — Encounter (HOSPITAL_BASED_OUTPATIENT_CLINIC_OR_DEPARTMENT_OTHER): Payer: Self-pay | Admitting: Emergency Medicine

## 2013-10-08 DIAGNOSIS — R072 Precordial pain: Secondary | ICD-10-CM

## 2013-10-08 DIAGNOSIS — I4891 Unspecified atrial fibrillation: Secondary | ICD-10-CM | POA: Diagnosis not present

## 2013-10-08 DIAGNOSIS — I252 Old myocardial infarction: Secondary | ICD-10-CM | POA: Diagnosis not present

## 2013-10-08 DIAGNOSIS — Z9861 Coronary angioplasty status: Secondary | ICD-10-CM | POA: Diagnosis not present

## 2013-10-08 DIAGNOSIS — E861 Hypovolemia: Secondary | ICD-10-CM | POA: Diagnosis not present

## 2013-10-08 DIAGNOSIS — R079 Chest pain, unspecified: Principal | ICD-10-CM | POA: Diagnosis present

## 2013-10-08 DIAGNOSIS — I2510 Atherosclerotic heart disease of native coronary artery without angina pectoris: Secondary | ICD-10-CM | POA: Diagnosis not present

## 2013-10-08 DIAGNOSIS — M25519 Pain in unspecified shoulder: Secondary | ICD-10-CM | POA: Insufficient documentation

## 2013-10-08 DIAGNOSIS — F411 Generalized anxiety disorder: Secondary | ICD-10-CM | POA: Diagnosis not present

## 2013-10-08 DIAGNOSIS — F1010 Alcohol abuse, uncomplicated: Secondary | ICD-10-CM | POA: Diagnosis not present

## 2013-10-08 DIAGNOSIS — Z7982 Long term (current) use of aspirin: Secondary | ICD-10-CM | POA: Diagnosis not present

## 2013-10-08 DIAGNOSIS — R739 Hyperglycemia, unspecified: Secondary | ICD-10-CM | POA: Diagnosis present

## 2013-10-08 DIAGNOSIS — F17200 Nicotine dependence, unspecified, uncomplicated: Secondary | ICD-10-CM | POA: Diagnosis not present

## 2013-10-08 DIAGNOSIS — I679 Cerebrovascular disease, unspecified: Secondary | ICD-10-CM

## 2013-10-08 DIAGNOSIS — F10129 Alcohol abuse with intoxication, unspecified: Secondary | ICD-10-CM

## 2013-10-08 DIAGNOSIS — E871 Hypo-osmolality and hyponatremia: Secondary | ICD-10-CM | POA: Diagnosis not present

## 2013-10-08 DIAGNOSIS — I5023 Acute on chronic systolic (congestive) heart failure: Secondary | ICD-10-CM | POA: Diagnosis not present

## 2013-10-08 DIAGNOSIS — I509 Heart failure, unspecified: Secondary | ICD-10-CM | POA: Diagnosis not present

## 2013-10-08 DIAGNOSIS — Z8673 Personal history of transient ischemic attack (TIA), and cerebral infarction without residual deficits: Secondary | ICD-10-CM | POA: Diagnosis not present

## 2013-10-08 DIAGNOSIS — R0602 Shortness of breath: Secondary | ICD-10-CM

## 2013-10-08 DIAGNOSIS — Z720 Tobacco use: Secondary | ICD-10-CM | POA: Diagnosis present

## 2013-10-08 DIAGNOSIS — I739 Peripheral vascular disease, unspecified: Secondary | ICD-10-CM | POA: Diagnosis present

## 2013-10-08 DIAGNOSIS — I447 Left bundle-branch block, unspecified: Secondary | ICD-10-CM | POA: Insufficient documentation

## 2013-10-08 DIAGNOSIS — I2589 Other forms of chronic ischemic heart disease: Secondary | ICD-10-CM | POA: Diagnosis not present

## 2013-10-08 DIAGNOSIS — E785 Hyperlipidemia, unspecified: Secondary | ICD-10-CM | POA: Diagnosis not present

## 2013-10-08 DIAGNOSIS — J441 Chronic obstructive pulmonary disease with (acute) exacerbation: Secondary | ICD-10-CM | POA: Diagnosis not present

## 2013-10-08 DIAGNOSIS — I9589 Other hypotension: Secondary | ICD-10-CM

## 2013-10-08 DIAGNOSIS — R7309 Other abnormal glucose: Secondary | ICD-10-CM | POA: Diagnosis not present

## 2013-10-08 DIAGNOSIS — I255 Ischemic cardiomyopathy: Secondary | ICD-10-CM

## 2013-10-08 DIAGNOSIS — I70219 Atherosclerosis of native arteries of extremities with intermittent claudication, unspecified extremity: Secondary | ICD-10-CM | POA: Diagnosis not present

## 2013-10-08 DIAGNOSIS — I10 Essential (primary) hypertension: Secondary | ICD-10-CM

## 2013-10-08 DIAGNOSIS — I959 Hypotension, unspecified: Secondary | ICD-10-CM | POA: Diagnosis not present

## 2013-10-08 DIAGNOSIS — I480 Paroxysmal atrial fibrillation: Secondary | ICD-10-CM

## 2013-10-08 DIAGNOSIS — I5022 Chronic systolic (congestive) heart failure: Secondary | ICD-10-CM

## 2013-10-08 DIAGNOSIS — R0789 Other chest pain: Secondary | ICD-10-CM

## 2013-10-08 DIAGNOSIS — I25119 Atherosclerotic heart disease of native coronary artery with unspecified angina pectoris: Secondary | ICD-10-CM

## 2013-10-08 DIAGNOSIS — I513 Intracardiac thrombosis, not elsewhere classified: Secondary | ICD-10-CM

## 2013-10-08 HISTORY — DX: Atherosclerotic heart disease of native coronary artery without angina pectoris: I25.10

## 2013-10-08 HISTORY — DX: Type 2 diabetes mellitus without complications: E11.9

## 2013-10-08 LAB — I-STAT CG4 LACTIC ACID, ED
LACTIC ACID, VENOUS: 3.09 mmol/L — AB (ref 0.5–2.2)
Lactic Acid, Venous: 2.76 mmol/L — ABNORMAL HIGH (ref 0.5–2.2)

## 2013-10-08 LAB — COMPREHENSIVE METABOLIC PANEL
ALBUMIN: 3.6 g/dL (ref 3.5–5.2)
ALT: 32 U/L (ref 0–53)
AST: 31 U/L (ref 0–37)
Alkaline Phosphatase: 45 U/L (ref 39–117)
Anion gap: 20 — ABNORMAL HIGH (ref 5–15)
BUN: 8 mg/dL (ref 6–23)
CALCIUM: 9.4 mg/dL (ref 8.4–10.5)
CO2: 20 mEq/L (ref 19–32)
Chloride: 90 mEq/L — ABNORMAL LOW (ref 96–112)
Creatinine, Ser: 1.1 mg/dL (ref 0.50–1.35)
GFR calc non Af Amer: 68 mL/min — ABNORMAL LOW (ref >90)
GFR, EST AFRICAN AMERICAN: 79 mL/min — AB (ref >90)
GLUCOSE: 93 mg/dL (ref 70–99)
Potassium: 3.4 mEq/L — ABNORMAL LOW (ref 3.7–5.3)
Sodium: 130 mEq/L — ABNORMAL LOW (ref 137–147)
Total Bilirubin: 0.2 mg/dL — ABNORMAL LOW (ref 0.3–1.2)
Total Protein: 7 g/dL (ref 6.0–8.3)

## 2013-10-08 LAB — TROPONIN I: Troponin I: <0.3 ng/mL (ref <0.30)

## 2013-10-08 LAB — CBC WITH DIFFERENTIAL/PLATELET
BASOS ABS: 0.1 10*3/uL (ref 0.0–0.1)
BASOS PCT: 1 % (ref 0–1)
EOS ABS: 0.8 10*3/uL — AB (ref 0.0–0.7)
EOS PCT: 11 % — AB (ref 0–5)
HCT: 33.1 % — ABNORMAL LOW (ref 39.0–52.0)
Hemoglobin: 12 g/dL — ABNORMAL LOW (ref 13.0–17.0)
LYMPHS ABS: 2.6 10*3/uL (ref 0.7–4.0)
Lymphocytes Relative: 35 % (ref 12–46)
MCH: 35.4 pg — AB (ref 26.0–34.0)
MCHC: 36.3 g/dL — AB (ref 30.0–36.0)
MCV: 97.6 fL (ref 78.0–100.0)
Monocytes Absolute: 0.6 10*3/uL (ref 0.1–1.0)
Monocytes Relative: 8 % (ref 3–12)
NEUTROS PCT: 45 % (ref 43–77)
Neutro Abs: 3.3 10*3/uL (ref 1.7–7.7)
PLATELETS: 209 10*3/uL (ref 150–400)
RBC: 3.39 MIL/uL — ABNORMAL LOW (ref 4.22–5.81)
RDW: 12.4 % (ref 11.5–15.5)
WBC: 7.4 10*3/uL (ref 4.0–10.5)

## 2013-10-08 LAB — ETHANOL: Alcohol, Ethyl (B): 222 mg/dL — ABNORMAL HIGH (ref 0–11)

## 2013-10-08 LAB — PRO B NATRIURETIC PEPTIDE: PRO B NATRI PEPTIDE: 547.1 pg/mL — AB (ref 0–125)

## 2013-10-08 MED ORDER — OXYCODONE-ACETAMINOPHEN 5-325 MG PO TABS
1.0000 | ORAL_TABLET | Freq: Once | ORAL | Status: AC
  Administered 2013-10-08: 1 via ORAL
  Filled 2013-10-08: qty 1

## 2013-10-08 MED ORDER — SODIUM CHLORIDE 0.9 % IV BOLUS (SEPSIS)
1000.0000 mL | Freq: Once | INTRAVENOUS | Status: AC
  Administered 2013-10-08: 1000 mL via INTRAVENOUS

## 2013-10-08 MED ORDER — SODIUM CHLORIDE 0.9 % IV BOLUS (SEPSIS)
500.0000 mL | Freq: Once | INTRAVENOUS | Status: DC
  Administered 2013-10-08: 500 mL via INTRAVENOUS

## 2013-10-08 MED ORDER — ALBUTEROL SULFATE (2.5 MG/3ML) 0.083% IN NEBU
5.0000 mg | INHALATION_SOLUTION | Freq: Once | RESPIRATORY_TRACT | Status: AC
Start: 2013-10-08 — End: 2013-10-08
  Administered 2013-10-08: 5 mg via RESPIRATORY_TRACT
  Filled 2013-10-08: qty 6

## 2013-10-08 MED ORDER — METHYLPREDNISOLONE SODIUM SUCC 125 MG IJ SOLR
125.0000 mg | Freq: Once | INTRAMUSCULAR | Status: AC
  Administered 2013-10-08: 125 mg via INTRAVENOUS
  Filled 2013-10-08: qty 2

## 2013-10-08 NOTE — Progress Notes (Signed)
  Called and discussed pt care w/ Dr. Rosalia Hammers at [facility].  Mr Jeffrey Melendez is a 67y/o male w/ PMHx of PVD, diastolic CHF (EF 46-65% in 2012), MI, COPD, CVA, HLD  Pt apparently presented to ED w/ multiple family members w/ concern for progressive painful bilateral LE swelling. This has been going on for 4 wks and is associated w/ progresive weaknes sand SOB. Tried breathing treatments at home w/o relief, and drinks ETOH for pain relief. Pt smokes tobacco daily. Pt goes to [place] for routine care.    Pt has been hypotensive at [facility] and has received 1.5L NS bolus.  Solumedrol 125  CXR: ? pulm edema? ProBNP 547 Cr 1.1 Lactic acid 3.09 and 2.76 WBC 7.4 ETOH 222 TROP neg.   Pt to be transferred to [facility] 10 in step down  Shelly Flatten, MD Family Medicine 10/08/2013, 11:46 PM

## 2013-10-08 NOTE — ED Notes (Signed)
Report received from CA, RN.  Pt alert, NAD, calm, interactive, resps e/u, speaking in clear complete sentences, c/o BLE pain, L>R, also "always sob" and endorses light headedness. (denies: nausea), requesting something to eat, NPO explained, [facility] here to transport pt to [facility]. Pt/family aware of and agreeable to plan.

## 2013-10-08 NOTE — ED Notes (Signed)
I helped patient to a better position in bed as 02 was only at 90-91 % room air. Patient stated he stays on 92-93 % at home. I placed patient on 2 ltrs 02 by nasal cannula, but stats stayed at 92%..  I increased oxygen to 3 ltrs by canula at got 94 % at best. I notified nurse, then took new set of vitals.

## 2013-10-08 NOTE — ED Provider Notes (Signed)
CSN: [id number]     Arrival date & time 10/08/13  5732 History  This chart was scribed for Hilario Quarry, MD by Evon Slack, ED Scribe. This patient was seen in room MH08/MH08 and the patient's care was started at 7:41 PM.     Chief Complaint  Patient presents with  . Leg Pain   Patient is a 68 y.o. male presenting with leg pain. The history is provided by the patient. No language interpreter was used.  Leg Pain Location:  Leg Injury: no   Leg location:  L leg and R leg Pain details:    Radiates to:  Does not radiate   Severity:  Moderate   Timing:  Constant   Progression:  Worsening Chronicity:  Chronic Dislocation: no   Associated symptoms: no fever    HPI Comments: Jeffrey Melendez is a 68 y.o. male who presents to the Emergency Department with a Hx of stroke, MI, PAD, and CHF complaining of gradually worsening bilateral throbbing leg pain onset several years prior. He states that he also has a HX of blood clots in his legs. He states that he has drank beer today to help with his pain. He states that walking up and down the steps makes the leg pain worse. Friend also says that he is having difficulty walking due to weakness for about 4 weeks. Friend states he was also having SOB today prior to arrival. He states that he  He states he has tried a breathing treatment with no relief. He states that he is a current everyday smoker. He denies fever or other related symptoms.    Past Medical History  Diagnosis Date  . COPD (chronic obstructive pulmonary disease)   . Myocardial infarction   . Stroke   . PAD (peripheral artery disease)   . CHF (congestive heart failure)   . Hyperlipidemia   . Anxiety   . Ischemic cardiomyopathy    Past Surgical History  Procedure Laterality Date  . Coronary stent placement      x 5  . Arm surgery    . Foot surgery    . Head surgery    . Facial fracture surgery    . Knee surgery    . Cardiac catheterization  09/2008  . Skin graft to right arm      History reviewed. No pertinent family history. History  Substance Use Topics  . Smoking status: Current Every Day Smoker -- 1.00 packs/day for 52 years    Types: Cigarettes  . Smokeless tobacco: Never Used  . Alcohol Use: 1.8 oz/week    3 Cans of beer per week    Review of Systems  Constitutional: Negative for fever.  Respiratory: Positive for cough and shortness of breath.   Musculoskeletal: Positive for arthralgias and gait problem.  All other systems reviewed and are negative.   Allergies  Other; Wellbutrin; and Niacin and related  Home Medications   Prior to Admission medications   Medication Sig Start Date End Date Taking? Authorizing Provider  ALPRAZolam Prudy Feeler) 1 MG tablet Take 1 mg by mouth 3 (three) times daily as needed. For anxiety     Historical Provider, MD  amLODipine (NORVASC) 10 MG tablet Take 5 mg by mouth daily.      Historical Provider, MD  aspirin 325 MG EC tablet Take 325 mg by mouth daily.      Historical Provider, MD  Carboxymethylcellulose Sodium 0.25 % SOLN Place 1 drop into both eyes 4 (four) times  daily.      Historical Provider, MD  Cholecalciferol 2000 UNITS TABS Take 1 tablet by mouth daily.      Historical Provider, MD  citalopram (CELEXA) 40 MG tablet Take 20 mg by mouth daily.      Historical Provider, MD  dipyridamole-aspirin (AGGRENOX) 25-200 MG per 12 hr capsule Take 1 capsule by mouth 2 (two) times daily.      Historical Provider, MD  fish oil-omega-3 fatty acids 1000 MG capsule Take 1 g by mouth daily.      Historical Provider, MD  FLUNISOLIDE, NASAL, NA Place 2 sprays into both nostrils 2 (two) times daily as needed. For allergies     Historical Provider, MD  furosemide (LASIX) 40 MG tablet Take 1 tablet (40 mg total) by mouth daily. 01/24/11 01/24/12  Kela Millin, MD  hydrOXYzine (ATARAX/VISTARIL) 25 MG tablet Take 25 mg by mouth 3 (three) times daily as needed. For anxiety      Historical Provider, MD  levalbuterol [facility] HFA) 45  MCG/ACT inhaler Inhale 2 puffs into the lungs every 6 (six) hours as needed for wheezing or shortness of breath. 01/06/11 01/06/12  Maryruth Bun Rama, MD  levalbuterol (XOPENEX) 0.63 MG/3ML nebulizer solution Take 3 mLs (0.63 mg total) by nebulization every 6 (six) hours as needed for wheezing or shortness of breath. 01/06/11 01/06/12  Christina P Rama, MD  lisinopril (PRINIVIL,ZESTRIL) 10 MG tablet Take 10 mg by mouth daily.      Historical Provider, MD  metoprolol (TOPROL-XL) 100 MG 24 hr tablet Take 100 mg by mouth daily.      Historical Provider, MD  mometasone [facility]) 220 MCG/INH inhaler Inhale 2 puffs into the lungs 2 (two) times daily.      Historical Provider, MD  Multiple Vitamin (MULTIVITAMIN) tablet Take 1 tablet by mouth daily.      Historical Provider, MD  nitroGLYCERIN (NITROSTAT) 0.4 MG SL tablet Place 0.4 mg under the tongue every 5 (five) minutes as needed. For chest pain     Historical Provider, MD  omeprazole (PRILOSEC) 20 MG capsule Take 20 mg by mouth daily.      Historical Provider, MD  potassium chloride (K-DUR) 10 MEQ tablet Take 2 tablets (20 mEq total) by mouth daily. 01/24/11 01/24/12  Kela Millin, MD  rosuvastatin (CRESTOR) 40 MG tablet Take 20 mg by mouth daily.      Historical Provider, MD  traZODone (DESYREL) 100 MG tablet Take 100 mg by mouth at bedtime as needed. For sleep     Historical Provider, MD   Triage Vitals: BP 87/52  Pulse 72  Temp(Src) 97.6 F (36.4 C) (Oral)  Resp 18  Ht 5' 6.5" (1.689 m)  Wt 162 lb (73.483 kg)  BMI 25.76 kg/m2  SpO2 93%  Physical Exam  Nursing note and vitals reviewed. Constitutional: He is oriented to person, place, and time. He appears well-developed and well-nourished.  HENT:  Head: Normocephalic and atraumatic.  Right Ear: External ear normal.  Left Ear: External ear normal.  Nose: Nose normal.  Mouth/Throat: Oropharynx is clear and moist.  Eyes: Conjunctivae and EOM are normal. Pupils are equal, round, and  reactive to light.  Neck: Normal range of motion. Neck supple.  Cardiovascular: Normal rate, regular rhythm, normal heart sounds and intact distal pulses.   Pulmonary/Chest: Effort normal and breath sounds normal.  Abdominal: Soft. Bowel sounds are normal.  Musculoskeletal: Normal range of motion.  Neurological: He is alert and oriented to person, place, and time.  He has normal reflexes.  Skin: Skin is warm and dry.  Psychiatric: He has a normal mood and affect. His behavior is normal. Judgment and thought content normal.    ED Course  Procedures (including critical care time) DIAGNOSTIC STUDIES: Oxygen Saturation is 93% on RA, adequate by my interpretation.    COORDINATION OF CARE: 8:29 PM-Discussed treatment plan which includes CXR, CBC panel, CMP and breathing treatment  with pt at bedside and pt agreed to plan.     Labs Review Labs Reviewed  CBC WITH DIFFERENTIAL - Abnormal; Notable for the following:    RBC 3.39 (*)    Hemoglobin 12.0 (*)    HCT 33.1 (*)    MCH 35.4 (*)    MCHC 36.3 (*)    Eosinophils Relative 11 (*)    Eosinophils Absolute 0.8 (*)    All other components within normal limits  COMPREHENSIVE METABOLIC PANEL - Abnormal; Notable for the following:    Sodium 130 (*)    Potassium 3.4 (*)    Chloride 90 (*)    Total Bilirubin 0.2 (*)    GFR calc non Af Amer 68 (*)    GFR calc Af Amer 79 (*)    Anion gap 20 (*)    All other components within normal limits  ETHANOL - Abnormal; Notable for the following:    Alcohol, Ethyl (B) 222 (*)    All other components within normal limits  I-STAT CG4 LACTIC ACID, ED - Abnormal; Notable for the following:    Lactic Acid, Venous 3.09 (*)    All other components within normal limits    Imaging Review Dg Chest Port 1 View  10/08/2013   CLINICAL DATA:  BILATERAL leg pain, history of deep venous thrombosis, COPD, MI, stroke, peripheral arterial disease, CHF, ischemic cardiomyopathy  EXAM: PORTABLE CHEST - 1 VIEW   COMPARISON:  Portable exam 2021 hr compared to 08/07/2013  FINDINGS: Upper normal heart size.  Sub pulmonary vascular congestion.  Mediastinal contours normal.  Diffuse accentuation of interstitial markings, slightly increased since previous exam question minimal edema.  No segmental consolidation, pleural effusion or pneumothorax.  Atherosclerotic calcification aortic arch.  No acute osseous findings.  IMPRESSION: Slight pulmonary vascular congestion with minimal diffuse interstitial prominence, question pulmonary edema.   Electronically Signed   By: Ulyses SouthwardMark  Boles M.D.   On: 10/08/2013 20:51     Date: 10/08/2013  Rate: 73  Rhythm: normal sinus rhythm  QRS Axis: right  Intervals: normal  ST/T Wave abnormalities: nonspecific ST/T changes  Conduction Disutrbances:nonspecific intraventricular conduction delay  Narrative Interpretation:   Old EKG Reviewed: unchanged    MDM   Final diagnoses:  COPD exacerbation  Hypotension, unspecified hypotension type    [age]; y.o male with cad, s/p mi, copd, chf , and hyperlipidemia who presents today with sob, chest discomfort and leg pain.  Patient has borderline hypotension here with elevated lactic acid of unclear etiology.  Patient has some increased markings on cxr and has been hydrated with on liter ns, but will not be given more currently as bp low but stable and concern for chf. Plan admission for above.   Discussed with Dr. Evelena PeatMerrel and plan [facility]  CRITICAL CARE Performed by: Ariahna Smiddy S Total critical care time: 45 Critical care time was exclusive of separately billable procedures and treating other patients. Critical care was necessary to treat or prevent imminent or life-threatening deterioration. Critical care was time spent personally by me on the following  activities: development of treatment plan with patient and/or surrogate as well as nursing, discussions with consultants, evaluation of patient's response to  treatment, examination of patient, obtaining history from patient or surrogate, ordering and performing treatments and interventions, ordering and review of laboratory studies, ordering and review of radiographic studies, pulse oximetry and re-evaluation of patient's condition.  I personally performed the services described in this documentation, which was scribed in my presence. The recorded information has been reviewed and considered.   Hilario Quarry, MD 10/08/13 [id number]

## 2013-10-08 NOTE — ED Notes (Signed)
Pt presents to ED with complaints of bilateral leg pain for the past 2 years.

## 2013-10-08 NOTE — ED Notes (Signed)
[facility] here to transport  

## 2013-10-09 ENCOUNTER — Encounter (HOSPITAL_COMMUNITY): Payer: Self-pay | Admitting: Internal Medicine

## 2013-10-09 DIAGNOSIS — I5022 Chronic systolic (congestive) heart failure: Secondary | ICD-10-CM

## 2013-10-09 DIAGNOSIS — F1010 Alcohol abuse, uncomplicated: Secondary | ICD-10-CM

## 2013-10-09 DIAGNOSIS — F10129 Alcohol abuse with intoxication, unspecified: Secondary | ICD-10-CM | POA: Diagnosis present

## 2013-10-09 DIAGNOSIS — I10 Essential (primary) hypertension: Secondary | ICD-10-CM

## 2013-10-09 DIAGNOSIS — R0602 Shortness of breath: Secondary | ICD-10-CM

## 2013-10-09 DIAGNOSIS — I209 Angina pectoris, unspecified: Secondary | ICD-10-CM

## 2013-10-09 DIAGNOSIS — I959 Hypotension, unspecified: Secondary | ICD-10-CM

## 2013-10-09 DIAGNOSIS — I5023 Acute on chronic systolic (congestive) heart failure: Secondary | ICD-10-CM

## 2013-10-09 DIAGNOSIS — I9589 Other hypotension: Secondary | ICD-10-CM

## 2013-10-09 DIAGNOSIS — R072 Precordial pain: Secondary | ICD-10-CM

## 2013-10-09 DIAGNOSIS — J441 Chronic obstructive pulmonary disease with (acute) exacerbation: Secondary | ICD-10-CM

## 2013-10-09 DIAGNOSIS — R079 Chest pain, unspecified: Secondary | ICD-10-CM | POA: Diagnosis present

## 2013-10-09 DIAGNOSIS — I219 Acute myocardial infarction, unspecified: Secondary | ICD-10-CM

## 2013-10-09 DIAGNOSIS — I2589 Other forms of chronic ischemic heart disease: Secondary | ICD-10-CM

## 2013-10-09 DIAGNOSIS — R0789 Other chest pain: Secondary | ICD-10-CM | POA: Diagnosis present

## 2013-10-09 DIAGNOSIS — I739 Peripheral vascular disease, unspecified: Secondary | ICD-10-CM

## 2013-10-09 DIAGNOSIS — I059 Rheumatic mitral valve disease, unspecified: Secondary | ICD-10-CM

## 2013-10-09 DIAGNOSIS — E871 Hypo-osmolality and hyponatremia: Secondary | ICD-10-CM

## 2013-10-09 DIAGNOSIS — I2510 Atherosclerotic heart disease of native coronary artery without angina pectoris: Secondary | ICD-10-CM

## 2013-10-09 DIAGNOSIS — I509 Heart failure, unspecified: Secondary | ICD-10-CM

## 2013-10-09 DIAGNOSIS — F17200 Nicotine dependence, unspecified, uncomplicated: Secondary | ICD-10-CM

## 2013-10-09 LAB — CBC
HCT: 34.2 % — ABNORMAL LOW (ref 39.0–52.0)
HEMOGLOBIN: 12.2 g/dL — AB (ref 13.0–17.0)
MCH: 35 pg — ABNORMAL HIGH (ref 26.0–34.0)
MCHC: 35.7 g/dL (ref 30.0–36.0)
MCV: 98 fL (ref 78.0–100.0)
Platelets: 176 10*3/uL (ref 150–400)
RBC: 3.49 MIL/uL — ABNORMAL LOW (ref 4.22–5.81)
RDW: 13.1 % (ref 11.5–15.5)
WBC: 7.1 10*3/uL (ref 4.0–10.5)

## 2013-10-09 LAB — BASIC METABOLIC PANEL
ANION GAP: 17 — AB (ref 5–15)
BUN: 8 mg/dL (ref 6–23)
CHLORIDE: 98 meq/L (ref 96–112)
CO2: 18 mEq/L — ABNORMAL LOW (ref 19–32)
Calcium: 8.8 mg/dL (ref 8.4–10.5)
Creatinine, Ser: 0.9 mg/dL (ref 0.50–1.35)
GFR calc Af Amer: >90 mL/min (ref >90)
GFR, EST NON AFRICAN AMERICAN: 87 mL/min — AB (ref >90)
Glucose, Bld: 248 mg/dL — ABNORMAL HIGH (ref 70–99)
POTASSIUM: 4.1 meq/L (ref 3.7–5.3)
SODIUM: 133 meq/L — AB (ref 137–147)

## 2013-10-09 LAB — TROPONIN I
Troponin I: <0.3 ng/mL (ref <0.30)
Troponin I: <0.3 ng/mL (ref <0.30)

## 2013-10-09 LAB — MRSA PCR SCREENING: MRSA by PCR: NEGATIVE

## 2013-10-09 LAB — HEMOGLOBIN A1C
HEMOGLOBIN A1C: 6.1 % — AB (ref <5.7)
MEAN PLASMA GLUCOSE: 128 mg/dL — AB (ref <117)

## 2013-10-09 LAB — LACTIC ACID, PLASMA: Lactic Acid, Venous: 3.8 mmol/L — ABNORMAL HIGH (ref 0.5–2.2)

## 2013-10-09 LAB — GLUCOSE, CAPILLARY
GLUCOSE-CAPILLARY: 189 mg/dL — AB (ref 70–99)
GLUCOSE-CAPILLARY: 238 mg/dL — AB (ref 70–99)
GLUCOSE-CAPILLARY: 381 mg/dL — AB (ref 70–99)
GLUCOSE-CAPILLARY: 328 mg/dL — AB (ref 70–99)
Glucose-Capillary: 371 mg/dL — ABNORMAL HIGH (ref 70–99)

## 2013-10-09 MED ORDER — VITAMIN B-1 100 MG PO TABS
100.0000 mg | ORAL_TABLET | Freq: Every day | ORAL | Status: DC
  Administered 2013-10-09 – 2013-10-11 (×3): 100 mg via ORAL
  Filled 2013-10-09 (×3): qty 1

## 2013-10-09 MED ORDER — ASPIRIN-DIPYRIDAMOLE ER 25-200 MG PO CP12
1.0000 | ORAL_CAPSULE | Freq: Two times a day (BID) | ORAL | Status: DC
  Administered 2013-10-09 – 2013-10-10 (×4): 1 via ORAL
  Filled 2013-10-09 (×6): qty 1

## 2013-10-09 MED ORDER — NITROGLYCERIN 0.4 MG SL SUBL: 0.4000 mg | SUBLINGUAL_TABLET | SUBLINGUAL | Status: DC | PRN

## 2013-10-09 MED ORDER — ACETAMINOPHEN 325 MG PO TABS: 650.0000 mg | ORAL_TABLET | Freq: Four times a day (QID) | ORAL | Status: DC | PRN

## 2013-10-09 MED ORDER — INSULIN ASPART 100 UNIT/ML ~~LOC~~ SOLN
0.0000 [IU] | Freq: Three times a day (TID) | SUBCUTANEOUS | Status: DC
  Administered 2013-10-09: 2 [IU] via SUBCUTANEOUS
  Administered 2013-10-09: 9 [IU] via SUBCUTANEOUS
  Administered 2013-10-09: 3 [IU] via SUBCUTANEOUS

## 2013-10-09 MED ORDER — OMEGA-3-ACID ETHYL ESTERS 1 G PO CAPS
1.0000 g | ORAL_CAPSULE | Freq: Every day | ORAL | Status: DC
  Administered 2013-10-09 – 2013-10-11 (×3): 1 g via ORAL
  Filled 2013-10-09 (×3): qty 1

## 2013-10-09 MED ORDER — VITAMIN D 1000 UNITS PO TABS
2000.0000 [IU] | ORAL_TABLET | Freq: Every day | ORAL | Status: DC
  Administered 2013-10-09 – 2013-10-11 (×3): 2000 [IU] via ORAL
  Filled 2013-10-09 (×3): qty 2

## 2013-10-09 MED ORDER — ADULT MULTIVITAMIN W/MINERALS CH
1.0000 | ORAL_TABLET | Freq: Every day | ORAL | Status: DC
  Administered 2013-10-09 – 2013-10-11 (×3): 1 via ORAL
  Filled 2013-10-09 (×3): qty 1

## 2013-10-09 MED ORDER — OMEGA-3 FATTY ACIDS 1000 MG PO CAPS: 1.0000 g | ORAL_CAPSULE | Freq: Every day | ORAL | Status: DC

## 2013-10-09 MED ORDER — METOPROLOL SUCCINATE ER 100 MG PO TB24
100.0000 mg | ORAL_TABLET | Freq: Every day | ORAL | Status: DC
  Administered 2013-10-09: 100 mg via ORAL
  Filled 2013-10-09 (×2): qty 1

## 2013-10-09 MED ORDER — IPRATROPIUM BROMIDE 0.02 % IN SOLN: 0.5000 mg | Freq: Four times a day (QID) | RESPIRATORY_TRACT | Status: DC

## 2013-10-09 MED ORDER — ATORVASTATIN CALCIUM 80 MG PO TABS
80.0000 mg | ORAL_TABLET | Freq: Every day | ORAL | Status: DC
  Administered 2013-10-09 – 2013-10-10 (×2): 80 mg via ORAL
  Filled 2013-10-09 (×3): qty 1

## 2013-10-09 MED ORDER — OXYCODONE HCL 5 MG PO TABS
5.0000 mg | ORAL_TABLET | ORAL | Status: DC | PRN
  Administered 2013-10-09 (×2): 5 mg via ORAL
  Filled 2013-10-09 (×3): qty 1

## 2013-10-09 MED ORDER — ONDANSETRON HCL 4 MG PO TABS: 4.0000 mg | ORAL_TABLET | Freq: Four times a day (QID) | ORAL | Status: DC | PRN

## 2013-10-09 MED ORDER — ASPIRIN EC 325 MG PO TBEC
325.0000 mg | DELAYED_RELEASE_TABLET | Freq: Every day | ORAL | Status: DC
  Administered 2013-10-09: 325 mg via ORAL
  Filled 2013-10-09: qty 1

## 2013-10-09 MED ORDER — ALBUTEROL SULFATE (2.5 MG/3ML) 0.083% IN NEBU
2.5000 mg | INHALATION_SOLUTION | RESPIRATORY_TRACT | Status: DC | PRN
  Administered 2013-10-10: 2.5 mg via RESPIRATORY_TRACT

## 2013-10-09 MED ORDER — THIAMINE HCL 100 MG/ML IJ SOLN
100.0000 mg | Freq: Every day | INTRAMUSCULAR | Status: DC
  Filled 2013-10-09 (×3): qty 1

## 2013-10-09 MED ORDER — ONDANSETRON HCL 4 MG/2ML IJ SOLN: 4.0000 mg | Freq: Four times a day (QID) | INTRAMUSCULAR | Status: DC | PRN

## 2013-10-09 MED ORDER — SODIUM CHLORIDE 0.9 % IV SOLN
INTRAVENOUS | Status: DC
  Administered 2013-10-09: 12:00:00 via INTRAVENOUS

## 2013-10-09 MED ORDER — TRAZODONE HCL 100 MG PO TABS
100.0000 mg | ORAL_TABLET | Freq: Every evening | ORAL | Status: DC | PRN
  Filled 2013-10-09: qty 1

## 2013-10-09 MED ORDER — METHYLPREDNISOLONE SODIUM SUCC 125 MG IJ SOLR
125.0000 mg | Freq: Once | INTRAMUSCULAR | Status: AC
  Administered 2013-10-09: 125 mg via INTRAVENOUS
  Filled 2013-10-09: qty 2

## 2013-10-09 MED ORDER — CITALOPRAM HYDROBROMIDE 20 MG PO TABS
20.0000 mg | ORAL_TABLET | Freq: Every day | ORAL | Status: DC
  Administered 2013-10-09 – 2013-10-11 (×3): 20 mg via ORAL
  Filled 2013-10-09 (×3): qty 1

## 2013-10-09 MED ORDER — PANTOPRAZOLE SODIUM 40 MG PO TBEC
40.0000 mg | DELAYED_RELEASE_TABLET | Freq: Every day | ORAL | Status: DC
  Administered 2013-10-09 – 2013-10-11 (×3): 40 mg via ORAL
  Filled 2013-10-09 (×3): qty 1

## 2013-10-09 MED ORDER — HYDROXYZINE HCL 25 MG PO TABS
25.0000 mg | ORAL_TABLET | Freq: Three times a day (TID) | ORAL | Status: DC | PRN
  Administered 2013-10-10: 25 mg via ORAL
  Filled 2013-10-09: qty 1

## 2013-10-09 MED ORDER — LORAZEPAM 2 MG/ML IJ SOLN: 1.0000 mg | Freq: Four times a day (QID) | INTRAMUSCULAR | Status: DC | PRN

## 2013-10-09 MED ORDER — FLUTICASONE PROPIONATE HFA 44 MCG/ACT IN AERO
2.0000 | INHALATION_SPRAY | Freq: Two times a day (BID) | RESPIRATORY_TRACT | Status: DC
  Administered 2013-10-09 – 2013-10-11 (×4): 2 via RESPIRATORY_TRACT
  Filled 2013-10-09: qty 10.6

## 2013-10-09 MED ORDER — LISINOPRIL 10 MG PO TABS
10.0000 mg | ORAL_TABLET | Freq: Every day | ORAL | Status: DC
  Administered 2013-10-09: 10 mg via ORAL
  Filled 2013-10-09 (×2): qty 1

## 2013-10-09 MED ORDER — ALBUTEROL SULFATE (2.5 MG/3ML) 0.083% IN NEBU
INHALATION_SOLUTION | RESPIRATORY_TRACT | Status: AC
  Administered 2013-10-09: 2.5 mg
  Filled 2013-10-09: qty 3

## 2013-10-09 MED ORDER — LORAZEPAM 0.5 MG PO TABS
1.0000 mg | ORAL_TABLET | Freq: Four times a day (QID) | ORAL | Status: DC | PRN
  Administered 2013-10-10: 1 mg via ORAL
  Filled 2013-10-09: qty 2

## 2013-10-09 MED ORDER — ALPRAZOLAM 0.5 MG PO TABS
1.0000 mg | ORAL_TABLET | Freq: Three times a day (TID) | ORAL | Status: DC | PRN
  Administered 2013-10-09 – 2013-10-11 (×3): 1 mg via ORAL
  Filled 2013-10-09 (×3): qty 2

## 2013-10-09 MED ORDER — NICOTINE 21 MG/24HR TD PT24
21.0000 mg | MEDICATED_PATCH | Freq: Every day | TRANSDERMAL | Status: DC
  Administered 2013-10-09 – 2013-10-11 (×3): 21 mg via TRANSDERMAL
  Filled 2013-10-09 (×3): qty 1

## 2013-10-09 MED ORDER — ALUM & MAG HYDROXIDE-SIMETH 200-200-20 MG/5ML PO SUSP: 30.0000 mL | Freq: Four times a day (QID) | ORAL | Status: DC | PRN

## 2013-10-09 MED ORDER — PERFLUTREN LIPID MICROSPHERE
1.0000 mL | INTRAVENOUS | Status: AC | PRN
  Administered 2013-10-09: 2 mL via INTRAVENOUS
  Filled 2013-10-09: qty 10

## 2013-10-09 MED ORDER — PREDNISONE 20 MG PO TABS
40.0000 mg | ORAL_TABLET | Freq: Every day | ORAL | Status: DC
  Administered 2013-10-09 – 2013-10-11 (×3): 40 mg via ORAL
  Filled 2013-10-09 (×5): qty 2

## 2013-10-09 MED ORDER — CARBOXYMETHYLCELLULOSE SODIUM 0.25 % OP SOLN: 1.0000 [drp] | Freq: Four times a day (QID) | OPHTHALMIC | Status: DC

## 2013-10-09 MED ORDER — HYDROMORPHONE HCL PF 1 MG/ML IJ SOLN: 0.5000 mg | INTRAMUSCULAR | Status: DC | PRN

## 2013-10-09 MED ORDER — ENOXAPARIN SODIUM 40 MG/0.4ML ~~LOC~~ SOLN
40.0000 mg | SUBCUTANEOUS | Status: DC
  Administered 2013-10-09 – 2013-10-10 (×2): 40 mg via SUBCUTANEOUS
  Filled 2013-10-09 (×3): qty 0.4

## 2013-10-09 MED ORDER — FOLIC ACID 1 MG PO TABS
1.0000 mg | ORAL_TABLET | Freq: Every day | ORAL | Status: DC
  Administered 2013-10-09 – 2013-10-11 (×3): 1 mg via ORAL
  Filled 2013-10-09 (×3): qty 1

## 2013-10-09 MED ORDER — LEVALBUTEROL HCL 0.63 MG/3ML IN NEBU: 0.6300 mg | INHALATION_SOLUTION | Freq: Four times a day (QID) | RESPIRATORY_TRACT | Status: DC

## 2013-10-09 MED ORDER — FLUTICASONE PROPIONATE 50 MCG/ACT NA SUSP
1.0000 | Freq: Every day | NASAL | Status: DC
  Administered 2013-10-09 – 2013-10-11 (×3): 1 via NASAL
  Filled 2013-10-09: qty 16

## 2013-10-09 MED ORDER — INSULIN ASPART 100 UNIT/ML ~~LOC~~ SOLN
0.0000 [IU] | Freq: Every day | SUBCUTANEOUS | Status: DC
  Administered 2013-10-09: 5 [IU] via SUBCUTANEOUS
  Administered 2013-10-10: 4 [IU] via SUBCUTANEOUS

## 2013-10-09 MED ORDER — POLYVINYL ALCOHOL 1.4 % OP SOLN
1.0000 [drp] | Freq: Four times a day (QID) | OPHTHALMIC | Status: DC
  Administered 2013-10-09 – 2013-10-11 (×3): 1 [drp] via OPHTHALMIC
  Filled 2013-10-09: qty 15

## 2013-10-09 MED ORDER — LORAZEPAM 2 MG/ML IJ SOLN: 2.0000 mg | INTRAMUSCULAR | Status: DC | PRN

## 2013-10-09 MED ORDER — ACETAMINOPHEN 650 MG RE SUPP: 650.0000 mg | Freq: Four times a day (QID) | RECTAL | Status: DC | PRN

## 2013-10-09 MED ORDER — IPRATROPIUM-ALBUTEROL 0.5-2.5 (3) MG/3ML IN SOLN
3.0000 mL | Freq: Four times a day (QID) | RESPIRATORY_TRACT | Status: DC
  Administered 2013-10-09 – 2013-10-11 (×4): 3 mL via RESPIRATORY_TRACT
  Filled 2013-10-09 (×5): qty 3

## 2013-10-09 MED ORDER — IPRATROPIUM-ALBUTEROL 0.5-2.5 (3) MG/3ML IN SOLN
3.0000 mL | RESPIRATORY_TRACT | Status: DC
  Administered 2013-10-09 (×3): 3 mL via RESPIRATORY_TRACT
  Filled 2013-10-09 (×3): qty 3

## 2013-10-09 NOTE — H&P (Signed)
[facility] Admission History and Physical       Jeffrey Melendez:[id number] DOB: 1945-05-21 DOA: 10/08/2013  Referring physician:  PCP: Pcp Not In System  Specialists:   Chief Complaint:  SOB and Chest Tightness and Lower Leg Pain  HPI: Jeffrey Melendez is a 68 y.o. male with a history of COPD, Chronic Systolic CHF, CAD, Previous CVA, Hyperlipidemia, and PAD who presents to the ED at [facility] due to increased pain in both lower legs and worsening SOB with chest tightness.   He reports having intermittent chest pain for 1 month.  He denies any fevers or chills cough or hemoptysis.   He was persuaded to go to the ED by his family this evening.    He was found to have hypotension in the ED and an ETOH level of 222, and an initial troponin of <0.30.  He was administered an IV bolus for his hypotension and IV Solumedrol for a COPD exacerbation and transferred to [facility] for admission.     Review of Systems:  Constitutional: No Weight Loss, No Weight Gain, Night Sweats, Fevers, Chills, Dizziness, Fatigue, Generalized Weakness HEENT: No Headaches, Difficulty Swallowing,Tooth/Dental Problems,Sore Throat,  No Sneezing, Rhinitis, Ear Ache, Nasal Congestion, or Post Nasal Drip,  Cardio-vascular:  No +Chest pain, Orthopnea, PND, +Edema in Lower Extremities, Anasarca, Dizziness, Palpitations  Resp: No +Dyspnea, +DOE, No Cough, No Hemoptysis, No Wheezing.    GI: No Heartburn, Indigestion, Abdominal Pain, Nausea, Vomiting, Diarrhea, Hematemesis, Hematochezia, Melena, Change in Bowel Habits,  Loss of Appetite  GU: No Dysuria, Change in Color of Urine, No Urgency or Frequency, No Flank pain.  Musculoskeletal: +Lower Leg Pain Bilaterally,   No Swelling, No Decreased Range of Motion, No Back Pain.  Neurologic: No Syncope, No Seizures, Muscle Weakness, Paresthesia, Vision Disturbance or Loss, No Diplopia, No Vertigo, No Difficulty Walking,  Skin: No Rash or Lesions. Psych: No Change in Mood or Affect, No  Depression or Anxiety, No Memory loss, No Confusion, or Hallucinations   Past Medical History  Diagnosis Date  . COPD (chronic obstructive pulmonary disease)   . Myocardial infarction   . Stroke   . PAD (peripheral artery disease)   . CHF (congestive heart failure)     systolic  . Hyperlipidemia   . Anxiety   . Ischemic cardiomyopathy     Past Surgical History  Procedure Laterality Date  . Coronary stent placement      x 5  . Arm surgery    . Foot surgery    . Head surgery    . Facial fracture surgery    . Knee surgery    . Cardiac catheterization  09/2008  . Skin graft to right arm       Prior to Admission medications   Medication Sig Start Date End Date Taking? Authorizing Provider  ALPRAZolam Prudy Feeler) 1 MG tablet Take 1 mg by mouth 3 (three) times daily as needed. For anxiety     Historical Provider, MD  amLODipine (NORVASC) 10 MG tablet Take 5 mg by mouth daily.      Historical Provider, MD  aspirin 325 MG EC tablet Take 325 mg by mouth daily.      Historical Provider, MD  Carboxymethylcellulose Sodium 0.25 % SOLN Place 1 drop into both eyes 4 (four) times daily.      Historical Provider, MD  Cholecalciferol 2000 UNITS TABS Take 1 tablet by mouth daily.      Historical Provider, MD  citalopram (CELEXA) 40 MG  tablet Take 20 mg by mouth daily.      Historical Provider, MD  dipyridamole-aspirin (AGGRENOX) 25-200 MG per 12 hr capsule Take 1 capsule by mouth 2 (two) times daily.      Historical Provider, MD  fish oil-omega-3 fatty acids 1000 MG capsule Take 1 g by mouth daily.      Historical Provider, MD  FLUNISOLIDE, NASAL, NA Place 2 sprays into both nostrils 2 (two) times daily as needed. For allergies     Historical Provider, MD  furosemide (LASIX) 40 MG tablet Take 1 tablet (40 mg total) by mouth daily. 01/24/11 01/24/12  Kela Millin, MD  hydrOXYzine (ATARAX/VISTARIL) 25 MG tablet Take 25 mg by mouth 3 (three) times daily as needed. For anxiety      Historical  Provider, MD  levalbuterol [facility] HFA) 45 MCG/ACT inhaler Inhale 2 puffs into the lungs every 6 (six) hours as needed for wheezing or shortness of breath. 01/06/11 01/06/12  Maryruth Bun Rama, MD  levalbuterol (XOPENEX) 0.63 MG/3ML nebulizer solution Take 3 mLs (0.63 mg total) by nebulization every 6 (six) hours as needed for wheezing or shortness of breath. 01/06/11 01/06/12  Christina P Rama, MD  lisinopril (PRINIVIL,ZESTRIL) 10 MG tablet Take 10 mg by mouth daily.      Historical Provider, MD  metoprolol (TOPROL-XL) 100 MG 24 hr tablet Take 100 mg by mouth daily.      Historical Provider, MD  mometasone [facility]) 220 MCG/INH inhaler Inhale 2 puffs into the lungs 2 (two) times daily.      Historical Provider, MD  Multiple Vitamin (MULTIVITAMIN) tablet Take 1 tablet by mouth daily.      Historical Provider, MD  nitroGLYCERIN (NITROSTAT) 0.4 MG SL tablet Place 0.4 mg under the tongue every 5 (five) minutes as needed. For chest pain     Historical Provider, MD  omeprazole (PRILOSEC) 20 MG capsule Take 20 mg by mouth daily.      Historical Provider, MD  potassium chloride (K-DUR) 10 MEQ tablet Take 2 tablets (20 mEq total) by mouth daily. 01/24/11 01/24/12  Kela Millin, MD  rosuvastatin (CRESTOR) 40 MG tablet Take 20 mg by mouth daily.      Historical Provider, MD  traZODone (DESYREL) 100 MG tablet Take 100 mg by mouth at bedtime as needed. For sleep     Historical Provider, MD     Allergies  Allergen Reactions  . Other     Med that started with a "G" for leg circulation-"goes against my heart"  . Wellbutrin [Bupropion Hcl]     Elevated BP  . Niacin And Related Rash     Social History:  reports that he has been smoking Cigarettes.  He has a 52 pack-year smoking history. He has never used smokeless tobacco. He reports that he drinks about 1.8 ounces of alcohol per week. He reports that he does not use illicit drugs.     History reviewed. No pertinent family history.     Physical  Exam:  GEN:  Pleasant Obese 67 y.o.Caucasian male examined and in no acute distress; cooperative with exam Filed Vitals:   10/08/13 2340 10/09/13 0045 10/09/13 0100 10/09/13 0115  BP: 110/68 113/63 107/58 109/61  Pulse: 69 72 67 77  Temp:  97.6 F (36.4 C)    TempSrc:  Oral    Resp:  21 20 17   Height:  5\' 6"  (1.676 m)    Weight:  77.2 kg (170 lb 3.1 oz)    SpO2: 95%  95% 95% 95%   Blood pressure 109/61, pulse 77, temperature 97.6 F (36.4 C), temperature source Oral, resp. rate 17, height 5\' 6"  (1.676 m), weight 77.2 kg (170 lb 3.1 oz), SpO2 95.00%. PSYCH: He is alert and oriented x4; does not appear anxious does not appear depressed; affect is normal HEENT: Normocephalic and Atraumatic, Mucous membranes pink; PERRLA; EOM intact; Fundi:  Benign;  No scleral icterus, Nares: Patent, Oropharynx: Clear, Fair Dentition,    Neck:  FROM, No Cervical Lymphadenopathy nor Thyromegaly or Carotid Bruit; No JVD; Breasts:: Not examined CHEST WALL: No tenderness CHEST: Normal respiration, clear to auscultation bilaterally HEART: Regular rate and rhythm; no murmurs rubs or gallops BACK: No kyphosis or scoliosis; No CVA tenderness ABDOMEN: Positive Bowel Sounds, Obese, Soft Non-Tender; No Masses, No Organomegaly. Rectal Exam: Not done EXTREMITIES: No Bone or Joint Deformity; Age-Appropriate Arthropathy of the Hands and knees; No Cyanosis, Clubbing, or Edema; No Ulcerations. Genitalia: not examined PULSES: 2+ and symmetric SKIN: Normal hydration no rash or ulceration CNS:  Alert and Oriented x 4, No Focal Deficits   Vascular: 1+ pulses palpable on Both Dorsal Pedis, Verified by Doppler   Labs on Admission:  Basic Metabolic Panel:  Recent Labs Lab 10/08/13 2030  NA 130*  K 3.4*  CL 90*  CO2 20  GLUCOSE 93  BUN 8  CREATININE 1.10  CALCIUM 9.4   Liver Function Tests:  Recent Labs Lab 10/08/13 2030  AST 31  ALT 32  ALKPHOS 45  BILITOT 0.2*  PROT 7.0  ALBUMIN 3.6   No results  found for this basename: LIPASE, AMYLASE,  in the last 168 hours No results found for this basename: AMMONIA,  in the last 168 hours CBC:  Recent Labs Lab 10/08/13 2030  WBC 7.4  NEUTROABS 3.3  HGB 12.0*  HCT 33.1*  MCV 97.6  PLT 209   Cardiac Enzymes:  Recent Labs Lab 10/08/13 2311  TROPONINI <0.30    BNP (last 3 results)  Recent Labs  10/08/13 2030  PROBNP 547.1*   CBG: No results found for this basename: GLUCAP,  in the last 168 hours  Radiological Exams on Admission: Dg Chest Port 1 View  10/08/2013   CLINICAL DATA:  BILATERAL leg pain, history of deep venous thrombosis, COPD, MI, stroke, peripheral arterial disease, CHF, ischemic cardiomyopathy  EXAM: PORTABLE CHEST - 1 VIEW  COMPARISON:  Portable exam 2021 hr compared to 08/07/2013  FINDINGS: Upper normal heart size.  Sub pulmonary vascular congestion.  Mediastinal contours normal.  Diffuse accentuation of interstitial markings, slightly increased since previous exam question minimal edema.  No segmental consolidation, pleural effusion or pneumothorax.  Atherosclerotic calcification aortic arch.  No acute osseous findings.  IMPRESSION: Slight pulmonary vascular congestion with minimal diffuse interstitial prominence, question pulmonary edema.   Electronically Signed   By: Ulyses Southward M.D.   On: 10/08/2013 20:51     EKG: Independently reviewed.    Assessment/Plan:   68 y.o. male with  Principal Problem:   1.   COPD exacerbation-   DuoNebs   IV Solumedrol   O2   Monitor O2 sats   Active Problems:   2.   SOB (shortness of breath)- Multifactorial, Due to #1, and #3   O2 PRN      3.   Systolic CHF, acute on chronic   Diurese when BP stable     4.   Hypotension   Gentle IV fluid challenges   Hold BP meds     5.  Other chest pain   Cardiac Monitoring   Cycle Troponins     6.   CAD (coronary artery disease)   Hx of Previous MI's, and Stent placement     7.   Tobacco abuse   Nicotine Patch while  Inpt   Counseled Re: Tobacco Cessation, Pre-contemplative     8    Peripheral vascular disease   On Aggrenox Rx   Refer for ABI studies       9.   Alcohol abuse with intoxication   ETON Level = 222   Monitor for withdrawal, and Follow CIWA Protocol   10.   Hyponatremia   Due to #3, and #9   IVFs with NSS   11.   DVT Prophylaxis   Lovenox    Code Status:     FULL CODE Family Communication:    No Family Present Disposition Plan:     Inpatient  Time spent:  3660 Minutes  Ron ParkerJENKINS,Awilda Covin C [facility] Pager [phone]   If 7AM -7PM Please Contact the Day Rounding Team MD for [facility]  If 7PM-7AM, Please Contact night-coverage  [email or web address] Password [email or web address] 10/09/2013, 1:43 AM

## 2013-10-09 NOTE — Progress Notes (Addendum)
Patient ID: [facility]  male  NFA:[id number]    DOB: 1945-09-20    DOA: 10/08/2013  PCP: Pcp Not In System  Assessment/Plan: Principal Problem:   Chest pain: somewhat atypical, reports chest pain with shortness of breath, some pleuritic component in musculoskeletal, intermittent chest pains with rest for a month. High risk, prior history of coronary artery disease, patient reports [place], has had stress tests in the past. History of hypertension, hyperlipidemia.  - Troponins negative so far, no acute EKG changes, obtain 2-D echocardiogram given shortness of breath and intermittent chest pain for EF, cardiology consult obtained for further risk stratification,? Stress test - Continue Aggrenox (prior history of CVA). Discontinue aspirin  - Continue beta blocker, statin, ACEI,   Active Problems:   Systolic CHF, acute on chronic: - chest x-ray did show slight pulmonary vascular congestion however Lactic acidosis at the time of admission, BNP 547 - Hold Lasix for now,  rechecking lactic acid, follow 2-D echo - I/O's -900 cc    CAD (coronary artery disease): As #1     Tobacco abuse - Patient counseled on smoking cessation, placed on nicotine patch     Peripheral vascular disease; complaining of lower extremity pain and claudication - On Aggrenox, check ABIs  - May benefit from Plendil    Hyperglycemia: Likely due to  Solu-Medrol, does not have a history of diabetes - Check hemoglobin A1c     Hypotension: -Borderline, hold Lasix for now, patient received gentle hydration in ED     COPD exacerbation: Improving, no wheezing - Currently stable, continue scheduled bronchodilators, O2, Flovent , Prednisone    Alcohol abuse with intoxication, alcohol level 222 at the time of admission - Continue CIWA with Ativan protocol    Hyponatremia - improving     DVT Prophylaxis: lovenox  Code Status: full code   Family  Communication:  Disposition:  Consultants:  Cardiology   Procedures:   none   Antibiotics:   none     Subjective:  patient seen and examined, still complaining of atypical chest pain, worse with breathing and movement, shortness of breath, pain in the legs   Objective: Weight change:   Intake/Output Summary (Last 24 hours) at 10/09/13 0852 Last data filed at 10/09/13 0730  Gross per 24 hour  Intake   1500 ml  Output   2025 ml  Net   -525 ml   Blood pressure 93/66, pulse 74, temperature 98.1 F (36.7 C), temperature source Oral, resp. rate 18, height 5\' 6"  (1.676 m), weight 77.2 kg (170 lb 3.1 oz), SpO2 96.00%.  Physical Exam: General: Alert and awake, oriented x3, not in any acute distress. CVS: S1-S2 clear, no murmur rubs or gallops Chest: no significant wheezing or rales  Abdomen: soft nontender, nondistended, normal bowel sounds  Extremities: no cyanosis, clubbing or edema noted bilaterally Neuro: Cranial nerves II-XII intact, no focal neurological deficits  Lab Results: Basic Metabolic Panel:  Recent Labs Lab 10/08/13 2030 10/09/13 0535  NA 130* 133*  K 3.4* 4.1  CL 90* 98  CO2 20 18*  GLUCOSE 93 248*  BUN 8 8  CREATININE 1.10 0.90  CALCIUM 9.4 8.8   Liver Function Tests:  Recent Labs Lab 10/08/13 2030  AST 31  ALT 32  ALKPHOS 45  BILITOT 0.2*  PROT 7.0  ALBUMIN 3.6   No results found for this basename: LIPASE, AMYLASE,  in the last 168 hours No results found  for this basename: AMMONIA,  in the last 168 hours CBC:  Recent Labs Lab 10/08/13 2030 10/09/13 0535  WBC 7.4 7.1  NEUTROABS 3.3  --   HGB 12.0* 12.2*  HCT 33.1* 34.2*  MCV 97.6 98.0  PLT 209 176   Cardiac Enzymes:  Recent Labs Lab 10/08/13 2311 10/09/13 0329  TROPONINI <0.30 <0.30   BNP: No components found with this basename: POCBNP,  CBG:  Recent Labs Lab 10/09/13 0831  GLUCAP 189*     Micro Results: Recent Results (from the past 240 hour(s))  MRSA  PCR SCREENING     Status: None   Collection Time    10/09/13  1:03 AM      Result Value Ref Range Status   MRSA by PCR NEGATIVE  NEGATIVE Final   Comment:            The GeneXpert MRSA Assay (FDA     approved for NASAL specimens     only), is one component of a     comprehensive MRSA colonization     surveillance program. It is not     intended to diagnose MRSA     infection nor to guide or     monitor treatment for     MRSA infections.    Studies/Results: Dg Chest Port 1 View  10/08/2013   CLINICAL DATA:  BILATERAL leg pain, history of deep venous thrombosis, COPD, MI, stroke, peripheral arterial disease, CHF, ischemic cardiomyopathy  EXAM: PORTABLE CHEST - 1 VIEW  COMPARISON:  Portable exam 2021 hr compared to 08/07/2013  FINDINGS: Upper normal heart size.  Sub pulmonary vascular congestion.  Mediastinal contours normal.  Diffuse accentuation of interstitial markings, slightly increased since previous exam question minimal edema.  No segmental consolidation, pleural effusion or pneumothorax.  Atherosclerotic calcification aortic arch.  No acute osseous findings.  IMPRESSION: Slight pulmonary vascular congestion with minimal diffuse interstitial prominence, question pulmonary edema.   Electronically Signed   By: Ulyses Southward M.D.   On: 10/08/2013 20:51    Medications: Scheduled Meds: . aspirin  325 mg Oral Daily  . atorvastatin  80 mg Oral q1800  . cholecalciferol  2,000 Units Oral Daily  . citalopram  20 mg Oral Daily  . dipyridamole-aspirin  1 capsule Oral BID  . enoxaparin (LOVENOX) injection  40 mg Subcutaneous Q24H  . fluticasone  1 spray Each Nare Daily  . fluticasone  2 puff Inhalation BID  . insulin aspart  0-5 Units Subcutaneous QHS  . insulin aspart  0-9 Units Subcutaneous TID WC  . ipratropium-albuterol  3 mL Nebulization Q4H  . lisinopril  10 mg Oral Daily  . methylPREDNISolone (SOLU-MEDROL) injection  125 mg Intravenous Once  . metoprolol succinate  100 mg Oral Daily   . nicotine  21 mg Transdermal Daily  . omega-3 acid ethyl esters  1 g Oral Daily  . pantoprazole  40 mg Oral Daily  . polyvinyl alcohol  1 drop Both Eyes QID      LOS: 1 day   Raynell Upton M.D. [facility] 10/09/2013, 8:52 AM Pager: [phone]  If 7PM-7AM, please contact night-coverage [email or web address] Password [email or web address]  **Disclaimer: This note was dictated with voice recognition software. Similar sounding words can inadvertently be transcribed and this note may contain transcription errors which may not have been corrected upon publication of note.**

## 2013-10-09 NOTE — Progress Notes (Signed)
Utilization Review Completed.Lianne Carreto T8/16/2015  

## 2013-10-09 NOTE — Progress Notes (Signed)
  Echocardiogram 2D Echocardiogram has been performed.  Georgian Co 10/09/2013, 5:43 PM

## 2013-10-09 NOTE — Consult Note (Signed)
Reason for Consult: Chest pain at rest, known CAD  Requesting Physician: Rai  Cardiologist: [facility], [facility]  HPI: This is a 68 y.o. 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 He presents with multiple complaints including chest pain, dyspnea and leg pain, hypotensive and wheezing in ED. He still smokes and had been drinking alcohol when he came in. He drinks "to help left shoulder pain".  ECG is nondiagnostic due to chronic LBBB. Troponin negative so far. Questionable pulmonary congestion on CXR, proBNP relatively low at 547 (was 1147 in 2012).  Clinic records mention "LV mural thrombus" in 2013, for which he was taking warfarin, diagnosed at [facility] (those records not available). He was seen by Dr. Donnie Aho in 2011, but I am not sure he has followed up with him since then. He was transferred to the [place] at that time. Reortedly had "nonobstructive disease" by cath at the [facility] 2010. His Cardiology care [facility] still through [facility].  Echo [facility] 2012 (similar to [facility] 2011, more recent studies are not available for review) Left ventricle: The cavity size was moderately dilated. Systolic function was moderately reduced. The estimated ejection fraction was in the range of 35% to 40%. Akinesis of the mid-distalanteroseptal, anterior, and apical myocardium. Hypokinesis of the inferior myocardium. - Mitral valve: Moderate regurgitation directed posteriorly. - Left atrium: The atrium was moderately dilated.    PMHx:  Past Medical History  Diagnosis Date  . COPD (chronic obstructive pulmonary disease)   . Myocardial infarction   . Stroke   . PAD (peripheral artery disease)   . CHF (congestive  heart failure)     systolic  . Hyperlipidemia   . Anxiety   . Ischemic cardiomyopathy   . Coronary artery disease   . Diabetes mellitus without complication    Past Surgical History  Procedure Laterality Date  . Coronary stent placement      x 5  . Arm surgery    . Foot surgery    . Head surgery    . Facial fracture surgery    . Knee surgery    . Cardiac catheterization  09/2008  . Skin graft to right arm    . Fracture surgery      FAMHx: History reviewed. No pertinent family history.  SOCHx:  reports that he has been smoking Cigarettes.  He has a 52 pack-year smoking history. He has never used smokeless tobacco. He reports that he drinks about 1.8 ounces of alcohol per week. He reports that he does not use illicit drugs.  ALLERGIES: Allergies  Allergen Reactions  . Other     Med that started with a "G" for leg circulation-"goes against my heart"  . Wellbutrin [Bupropion Hcl]     Elevated BP  . Niacin And Related Rash    ROS: Constitutional: No Weight Loss, No Weight Gain, Night Sweats, Fevers, Chills, Dizziness, Fatigue, Generalized Weakness  HEENT: No Headaches, Difficulty Swallowing,Tooth/Dental Problems,Sore Throat,  No Sneezing, Rhinitis, Ear Ache, Nasal Congestion, or Post Nasal Drip,  Cardio-vascular: No +Chest pain, Orthopnea, PND, +Edema in Lower Extremities, Anasarca, Dizziness, Palpitations  Resp: No +Dyspnea, +DOE, No Cough, No Hemoptysis, No Wheezing.  GI: No Heartburn, Indigestion, Abdominal Pain, Nausea, Vomiting, Diarrhea, Hematemesis, Hematochezia, Melena,  Change in Bowel Habits, Loss of Appetite  GU: No Dysuria, Change in Color of Urine, No Urgency or Frequency, No Flank pain.  Musculoskeletal: +Lower Leg Pain Bilaterally, No Swelling, No Decreased Range of Motion, No Back Pain.  Neurologic: No Syncope, No Seizures, Muscle Weakness, Paresthesia, Vision Disturbance or Loss, No Diplopia, No Vertigo, No Difficulty Walking,  Skin: No Rash or Lesions.    Psych: No Change in Mood or Affect, No Depression or Anxiety, No Memory loss, No Confusion, or Hallucinations   HOME MEDICATIONS: Prescriptions prior to admission  Medication Sig Dispense Refill  . ALPRAZolam (XANAX) 1 MG tablet Take 1 mg by mouth 3 (three) times daily as needed. For anxiety       . amLODipine (NORVASC) 10 MG tablet Take 5 mg by mouth daily.        Marland Kitchen aspirin 325 MG EC tablet Take 325 mg by mouth daily.        . Carboxymethylcellulose Sodium 0.25 % SOLN Place 1 drop into both eyes 4 (four) times daily.        . Cholecalciferol 2000 UNITS TABS Take 1 tablet by mouth daily.        . citalopram (CELEXA) 40 MG tablet Take 20 mg by mouth daily.        Marland Kitchen dipyridamole-aspirin (AGGRENOX) 25-200 MG per 12 hr capsule Take 1 capsule by mouth 2 (two) times daily.        . fish oil-omega-3 fatty acids 1000 MG capsule Take 1 g by mouth daily.        Marland Kitchen FLUNISOLIDE, NASAL, NA Place 2 sprays into both nostrils 2 (two) times daily as needed. For allergies       . furosemide (LASIX) 40 MG tablet Take 1 tablet (40 mg total) by mouth daily.  30 tablet  1  . hydrOXYzine (ATARAX/VISTARIL) 25 MG tablet Take 25 mg by mouth 3 (three) times daily as needed. For anxiety        . levalbuterol (XOPENEX HFA) 45 MCG/ACT inhaler Inhale 2 puffs into the lungs every 6 (six) hours as needed for wheezing or shortness of breath.  1 Inhaler  3  . levalbuterol (XOPENEX) 0.63 MG/3ML nebulizer solution Take 3 mLs (0.63 mg total) by nebulization every 6 (six) hours as needed for wheezing or shortness of breath.  3 mL  6  . lisinopril (PRINIVIL,ZESTRIL) 10 MG tablet Take 10 mg by mouth daily.        . metoprolol (TOPROL-XL) 100 MG 24 hr tablet Take 100 mg by mouth daily.        . mometasone (ASMANEX) 220 MCG/INH inhaler Inhale 2 puffs into the lungs 2 (two) times daily.        . Multiple Vitamin (MULTIVITAMIN) tablet Take 1 tablet by mouth daily.        . nitroGLYCERIN (NITROSTAT) 0.4 MG SL tablet Place 0.4 mg under  the tongue every 5 (five) minutes as needed. For chest pain       . omeprazole (PRILOSEC) 20 MG capsule Take 20 mg by mouth daily.        . potassium chloride (K-DUR) 10 MEQ tablet Take 2 tablets (20 mEq total) by mouth daily.  60 tablet  0  . rosuvastatin (CRESTOR) 40 MG tablet Take 20 mg by mouth daily.        . traZODone (DESYREL) 100 MG tablet Take 100 mg by mouth at bedtime as needed. For sleep         HOSPITAL  MEDICATIONS: Prior to Admission:  Prescriptions prior to admission  Medication Sig Dispense Refill  . ALPRAZolam (XANAX) 1 MG tablet Take 1 mg by mouth 3 (three) times daily as needed. For anxiety       . amLODipine (NORVASC) 10 MG tablet Take 5 mg by mouth daily.        Marland Kitchen. aspirin 325 MG EC tablet Take 325 mg by mouth daily.        . Carboxymethylcellulose Sodium 0.25 % SOLN Place 1 drop into both eyes 4 (four) times daily.        . Cholecalciferol 2000 UNITS TABS Take 1 tablet by mouth daily.        . citalopram (CELEXA) 40 MG tablet Take 20 mg by mouth daily.        Marland Kitchen. dipyridamole-aspirin (AGGRENOX) 25-200 MG per 12 hr capsule Take 1 capsule by mouth 2 (two) times daily.        . fish oil-omega-3 fatty acids 1000 MG capsule Take 1 g by mouth daily.        Marland Kitchen. FLUNISOLIDE, NASAL, NA Place 2 sprays into both nostrils 2 (two) times daily as needed. For allergies       . furosemide (LASIX) 40 MG tablet Take 1 tablet (40 mg total) by mouth daily.  30 tablet  1  . hydrOXYzine (ATARAX/VISTARIL) 25 MG tablet Take 25 mg by mouth 3 (three) times daily as needed. For anxiety        . levalbuterol (XOPENEX HFA) 45 MCG/ACT inhaler Inhale 2 puffs into the lungs every 6 (six) hours as needed for wheezing or shortness of breath.  1 Inhaler  3  . levalbuterol (XOPENEX) 0.63 MG/3ML nebulizer solution Take 3 mLs (0.63 mg total) by nebulization every 6 (six) hours as needed for wheezing or shortness of breath.  3 mL  6  . lisinopril (PRINIVIL,ZESTRIL) 10 MG tablet Take 10 mg by mouth daily.          . metoprolol (TOPROL-XL) 100 MG 24 hr tablet Take 100 mg by mouth daily.        . mometasone (ASMANEX) 220 MCG/INH inhaler Inhale 2 puffs into the lungs 2 (two) times daily.        . Multiple Vitamin (MULTIVITAMIN) tablet Take 1 tablet by mouth daily.        . nitroGLYCERIN (NITROSTAT) 0.4 MG SL tablet Place 0.4 mg under the tongue every 5 (five) minutes as needed. For chest pain       . omeprazole (PRILOSEC) 20 MG capsule Take 20 mg by mouth daily.        . potassium chloride (K-DUR) 10 MEQ tablet Take 2 tablets (20 mEq total) by mouth daily.  60 tablet  0  . rosuvastatin (CRESTOR) 40 MG tablet Take 20 mg by mouth daily.        . traZODone (DESYREL) 100 MG tablet Take 100 mg by mouth at bedtime as needed. For sleep        Scheduled: . atorvastatin  80 mg Oral q1800  . cholecalciferol  2,000 Units Oral Daily  . citalopram  20 mg Oral Daily  . dipyridamole-aspirin  1 capsule Oral BID  . enoxaparin (LOVENOX) injection  40 mg Subcutaneous Q24H  . fluticasone  1 spray Each Nare Daily  . fluticasone  2 puff Inhalation BID  . folic acid  1 mg Oral Daily  . insulin aspart  0-5 Units Subcutaneous QHS  . insulin aspart  0-9 Units Subcutaneous TID  WC  . ipratropium-albuterol  3 mL Nebulization Q4H  . lisinopril  10 mg Oral Daily  . metoprolol succinate  100 mg Oral Daily  . multivitamin with minerals  1 tablet Oral Daily  . nicotine  21 mg Transdermal Daily  . omega-3 acid ethyl esters  1 g Oral Daily  . pantoprazole  40 mg Oral Daily  . polyvinyl alcohol  1 drop Both Eyes QID  . predniSONE  40 mg Oral Q breakfast  . thiamine  100 mg Oral Daily   Or  . thiamine  100 mg Intravenous Daily    VITALS: Blood pressure 149/72, pulse 74, temperature 98.1 F (36.7 C), temperature source Oral, resp. rate 18, height 5\' 6"  (1.676 m), weight 170 lb 3.1 oz (77.2 kg), SpO2 96.00%.  PHYSICAL EXAM:  General: Alert, oriented x3, no distress Head: no evidence of trauma, PERRL, EOMI, no exophtalmos or  lid lag, no myxedema, no xanthelasma; normal ears, nose and oropharynx Neck: normal jugular venous pulsations and no hepatojugular reflux; brisk carotid pulses without delay and no carotid bruits Chest: clear to auscultation, no signs of consolidation by percussion or palpation, normal fremitus, symmetrical and full respiratory excursions Cardiovascular: normal position and quality of the apical impulse, regular rhythm, normal first heart sound and paradoxically split second heart sound, no rubs or gallops, no murmur Abdomen: no tenderness or distention, no masses by palpation, no abnormal pulsatility or arterial bruits, normal bowel sounds, no hepatosplenomegaly Extremities: scarring and contracture of left forearm following industrial accident; no clubbing, cyanosis;  no edema; 2+ radial, ulnar and brachial pulses on the right; 2+ right femoral, absent posterior tibial and dorsalis pedis pulses; 2+ left femoral, absent posterior tibial and dorsalis pedis pulses; bilateral femoral bruits Neurological: grossly nonfocal   LABS  CBC  Recent Labs  10/08/13 2030 10/09/13 0535  WBC 7.4 7.1  NEUTROABS 3.3  --   HGB 12.0* 12.2*  HCT 33.1* 34.2*  MCV 97.6 98.0  PLT 209 176   Basic Metabolic Panel  Recent Labs  10/08/13 2030 10/09/13 0535  NA 130* 133*  K 3.4* 4.1  CL 90* 98  CO2 20 18*  GLUCOSE 93 248*  BUN 8 8  CREATININE 1.10 0.90  CALCIUM 9.4 8.8   Liver Function Tests  Recent Labs  10/08/13 2030  AST 31  ALT 32  ALKPHOS 45  BILITOT 0.2*  PROT 7.0  ALBUMIN 3.6   No results found for this basename: LIPASE, AMYLASE,  in the last 72 hours Cardiac Enzymes  Recent Labs  10/08/13 2311 10/09/13 0329 10/09/13 0955  TROPONINI <0.30 <0.30 <0.30    IMAGING: Dg Chest Port 1 View  10/08/2013   CLINICAL DATA:  BILATERAL leg pain, history of deep venous thrombosis, COPD, MI, stroke, peripheral arterial disease, CHF, ischemic cardiomyopathy  EXAM: PORTABLE CHEST - 1 VIEW   COMPARISON:  Portable exam 2021 hr compared to 08/07/2013  FINDINGS: Upper normal heart size.  Sub pulmonary vascular congestion.  Mediastinal contours normal.  Diffuse accentuation of interstitial markings, slightly increased since previous exam question minimal edema.  No segmental consolidation, pleural effusion or pneumothorax.  Atherosclerotic calcification aortic arch.  No acute osseous findings.  IMPRESSION: Slight pulmonary vascular congestion with minimal diffuse interstitial prominence, question pulmonary edema.   Electronically Signed   By: Ulyses Southward M.D.   On: 10/08/2013 20:51    ECG: NSR, LBBB  TELEMETRY: NSR, long PR, LBBB  IMPRESSION: Atypical chest pain in a patient with established CAD and  LBBB and negative cardiac enzymes.  I do not think he has acute HF exacerbation, on the contrary he was probably mildly hypovolemic on arrival History of LV thrombus, not currently on warfarin Possible history of PAF Severe PAD  RECOMMENDATION: Lexiscan Myoview is appropriate if not wheezing tomorrow With extensive apical LV wall motion abnormality and previous documented LV thrombus and strokes, could easily make an argument for lifelong warfarin anticoagulation. This would also be indicated for atrial fibrillation if this diagnosis accurate. (This may have been stopped because he drinks excessively or is not compliant with f/u).   Time Spent Directly with Patient: 45 minutes  Thurmon Fair, MD, [facility] [phone] office [phone] pager   10/09/2013, 12:34 PM

## 2013-10-10 ENCOUNTER — Other Ambulatory Visit (HOSPITAL_COMMUNITY): Payer: Medicare Other

## 2013-10-10 ENCOUNTER — Observation Stay (HOSPITAL_COMMUNITY): Payer: Medicare HMO

## 2013-10-10 DIAGNOSIS — I4891 Unspecified atrial fibrillation: Secondary | ICD-10-CM

## 2013-10-10 DIAGNOSIS — R079 Chest pain, unspecified: Secondary | ICD-10-CM

## 2013-10-10 DIAGNOSIS — M79609 Pain in unspecified limb: Secondary | ICD-10-CM

## 2013-10-10 DIAGNOSIS — I739 Peripheral vascular disease, unspecified: Secondary | ICD-10-CM

## 2013-10-10 LAB — BASIC METABOLIC PANEL
Anion gap: 10 (ref 5–15)
BUN: 13 mg/dL (ref 6–23)
CO2: 26 mEq/L (ref 19–32)
Calcium: 9.4 mg/dL (ref 8.4–10.5)
Chloride: 102 mEq/L (ref 96–112)
Creatinine, Ser: 0.82 mg/dL (ref 0.50–1.35)
Glucose, Bld: 152 mg/dL — ABNORMAL HIGH (ref 70–99)
POTASSIUM: 4 meq/L (ref 3.7–5.3)
SODIUM: 138 meq/L (ref 137–147)

## 2013-10-10 LAB — GLUCOSE, CAPILLARY
GLUCOSE-CAPILLARY: 178 mg/dL — AB (ref 70–99)
GLUCOSE-CAPILLARY: 305 mg/dL — AB (ref 70–99)

## 2013-10-10 MED ORDER — INSULIN ASPART 100 UNIT/ML ~~LOC~~ SOLN
3.0000 [IU] | Freq: Three times a day (TID) | SUBCUTANEOUS | Status: DC
  Administered 2013-10-10 – 2013-10-11 (×3): 3 [IU] via SUBCUTANEOUS

## 2013-10-10 MED ORDER — REGADENOSON 0.4 MG/5ML IV SOLN
INTRAVENOUS | Status: AC
  Administered 2013-10-10: 0.4 mg via INTRAVENOUS
  Filled 2013-10-10: qty 5

## 2013-10-10 MED ORDER — INSULIN ASPART 100 UNIT/ML ~~LOC~~ SOLN
0.0000 [IU] | Freq: Three times a day (TID) | SUBCUTANEOUS | Status: DC
  Administered 2013-10-10: 3 [IU] via SUBCUTANEOUS

## 2013-10-10 MED ORDER — TECHNETIUM TC 99M SESTAMIBI - CARDIOLITE
10.0000 | Freq: Once | INTRAVENOUS | Status: AC | PRN
  Administered 2013-10-10: 08:00:00 10 via INTRAVENOUS

## 2013-10-10 MED ORDER — REGADENOSON 0.4 MG/5ML IV SOLN
0.4000 mg | Freq: Once | INTRAVENOUS | Status: AC
  Administered 2013-10-10: 0.4 mg via INTRAVENOUS
  Filled 2013-10-10: qty 5

## 2013-10-10 MED ORDER — ALBUTEROL SULFATE (2.5 MG/3ML) 0.083% IN NEBU
INHALATION_SOLUTION | RESPIRATORY_TRACT | Status: AC
  Administered 2013-10-10: 2.5 mg via RESPIRATORY_TRACT
  Filled 2013-10-10: qty 3

## 2013-10-10 MED ORDER — METOPROLOL SUCCINATE ER 50 MG PO TB24
50.0000 mg | ORAL_TABLET | Freq: Every day | ORAL | Status: DC
  Administered 2013-10-10 – 2013-10-11 (×2): 50 mg via ORAL
  Filled 2013-10-10 (×2): qty 1

## 2013-10-10 MED ORDER — TECHNETIUM TC 99M SESTAMIBI - CARDIOLITE
30.0000 | Freq: Once | INTRAVENOUS | Status: AC | PRN
  Administered 2013-10-10: 30 via INTRAVENOUS

## 2013-10-10 NOTE — Progress Notes (Signed)
Patient ID: Jeffrey Melendez  male  IHK:[id number]    DOB: 1945/07/28    DOA: 10/08/2013  PCP: Pcp Not In System  Assessment/Plan: Principal Problem:   Chest pain: somewhat atypical, reports chest pain with shortness of breath, some pleuritic component in musculoskeletal, intermittent chest pains with rest for a month. High risk, prior history of coronary artery disease, patient reports he had 2 heart-attacks and 2 stents in [place], [place], has had stress tests in the past. History of hypertension, hyperlipidemia.  - Troponins negative x3, no acute EKG changes,  - 2-D echo showed EF of 30-35%, akinesis and scarring of the anteroseptal, anterior and apical myocardium, consistent with infarction in the distribution of the LAD, severe hypokinesis and scarring of the entire inferior and anteroseptal myocardium, infarction in distribution of RCA. Grade 2 diastolic dysfunction.  - Stress test ordered today per cardiology recommendations, may need cardiac cath - Continue Aggrenox (prior history of CVA). Discontinue aspirin  - Holding ACE inhibitor secondary to hypotension, beta blocker decreased, BP borderline in 90s   Active Problems:   Systolic CHF, acute on chronic: EF 30-35%, prior EF unknown, has a history of known CAD, ischemic cardiomyopathy - chest x-ray did show slight pulmonary vascular congestion however Lactic acidosis at the time of admission, BNP 547 - BP borderline, holding ACE inhibitor, continue to hold Lasix (currently somewhat hypovolemic), -2.45 L, lactic acid 3.8 - defer to cards for CHF management.     CAD (coronary artery disease): As #1     Tobacco abuse - Patient counseled on smoking cessation, placed on nicotine patch     Peripheral vascular disease; complaining of lower extremity pain and claudication - On Aggrenox, ABIs and Doppler ultrasound of the lower extremities pending - May benefit from Plendil    Hyperglycemia/diabetes mellitus: Likely due to  Solu-Medrol,  -  Hemoglobin A1c 6.1, likely has diabetes mellitus, patient was not on any oral hypoglycemics prior to admission -CBGs worsened due to steroids -Continue sliding scale insulin, increase to moderate, add meal coverage    Hypotension: -Borderline, hold Lasix for now, patient received gentle hydration in ED     COPD exacerbation: Improving, no wheezing - Currently stable, continue scheduled bronchodilators, O2, Flovent , Prednisone    Alcohol abuse with intoxication, alcohol level 222 at the time of admission - Continue CIWA with Ativan protocol    Hyponatremia - improving     DVT Prophylaxis: lovenox  Code Status: full code   Family Communication:  Disposition:  Consultants:  Cardiology   Procedures:  2-D echo  Antibiotics:   none     Subjective: Chest pain is better however still having pain in the legs   Objective: Weight change: 0.617 kg (1 lb 5.8 oz)  Intake/Output Summary (Last 24 hours) at 10/10/13 0835 Last data filed at 10/10/13 0200  Gross per 24 hour  Intake 546.67 ml  Output   2475 ml  Net -1928.33 ml   Blood pressure 128/71, pulse 74, temperature 97.5 F (36.4 C), temperature source Oral, resp. rate 28, height 5\' 6"  (1.676 m), weight 74.1 kg (163 lb 5.8 oz), SpO2 97.00%.  Physical Exam: General: Alert and awake, oriented x3, not in any acute distress. CVS: S1-S2 clear, no murmur rubs or gallops Chest: no significant wheezing or rales  Abdomen: soft nontender, nondistended, normal bowel sounds  Extremities: no cyanosis, clubbing or edema noted bilaterally Neuro: Cranial nerves II-XII intact, no focal neurological deficits  Lab Results: Basic Metabolic Panel:  Recent Labs  Lab 10/09/13 0535 10/10/13 0341  NA 133* 138  K 4.1 4.0  CL 98 102  CO2 18* 26  GLUCOSE 248* 152*  BUN 8 13  CREATININE 0.90 0.82  CALCIUM 8.8 9.4   Liver Function Tests:  Recent Labs Lab 10/08/13 2030  AST 31  ALT 32  ALKPHOS 45  BILITOT 0.2*  PROT 7.0    ALBUMIN 3.6   No results found for this basename: LIPASE, AMYLASE,  in the last 168 hours No results found for this basename: AMMONIA,  in the last 168 hours CBC:  Recent Labs Lab 10/08/13 2030 10/09/13 0535  WBC 7.4 7.1  NEUTROABS 3.3  --   HGB 12.0* 12.2*  HCT 33.1* 34.2*  MCV 97.6 98.0  PLT 209 176   Cardiac Enzymes:  Recent Labs Lab 10/09/13 0329 10/09/13 0955 10/09/13 1326  TROPONINI <0.30 <0.30 <0.30   BNP: No components found with this basename: POCBNP,  CBG:  Recent Labs Lab 10/09/13 0831 10/09/13 1144 10/09/13 1626 10/09/13 2010 10/09/13 2215  GLUCAP 189* 238* 381* 328* 371*     Micro Results: Recent Results (from the past 240 hour(s))  MRSA PCR SCREENING     Status: None   Collection Time    10/09/13  1:03 AM      Result Value Ref Range Status   MRSA by PCR NEGATIVE  NEGATIVE Final   Comment:            The GeneXpert MRSA Assay (FDA     approved for NASAL specimens     only), is one component of a     comprehensive MRSA colonization     surveillance program. It is not     intended to diagnose MRSA     infection nor to guide or     monitor treatment for     MRSA infections.    Studies/Results: Dg Chest Port 1 View  10/08/2013   CLINICAL DATA:  BILATERAL leg pain, history of deep venous thrombosis, COPD, MI, stroke, peripheral arterial disease, CHF, ischemic cardiomyopathy  EXAM: PORTABLE CHEST - 1 VIEW  COMPARISON:  Portable exam 2021 hr compared to 08/07/2013  FINDINGS: Upper normal heart size.  Sub pulmonary vascular congestion.  Mediastinal contours normal.  Diffuse accentuation of interstitial markings, slightly increased since previous exam question minimal edema.  No segmental consolidation, pleural effusion or pneumothorax.  Atherosclerotic calcification aortic arch.  No acute osseous findings.  IMPRESSION: Slight pulmonary vascular congestion with minimal diffuse interstitial prominence, question pulmonary edema.   Electronically Signed    By: Ulyses Southward M.D.   On: 10/08/2013 20:51    Medications: Scheduled Meds: . atorvastatin  80 mg Oral q1800  . cholecalciferol  2,000 Units Oral Daily  . citalopram  20 mg Oral Daily  . dipyridamole-aspirin  1 capsule Oral BID  . enoxaparin (LOVENOX) injection  40 mg Subcutaneous Q24H  . fluticasone  1 spray Each Nare Daily  . fluticasone  2 puff Inhalation BID  . folic acid  1 mg Oral Daily  . insulin aspart  0-15 Units Subcutaneous TID WC  . insulin aspart  0-5 Units Subcutaneous QHS  . ipratropium-albuterol  3 mL Nebulization QID  . metoprolol succinate  50 mg Oral Daily  . multivitamin with minerals  1 tablet Oral Daily  . nicotine  21 mg Transdermal Daily  . omega-3 acid ethyl esters  1 g Oral Daily  . pantoprazole  40 mg Oral Daily  . polyvinyl alcohol  1  drop Both Eyes QID  . predniSONE  40 mg Oral Q breakfast  . thiamine  100 mg Oral Daily   Or  . thiamine  100 mg Intravenous Daily      LOS: 2 days   Albino Bufford M.D. [facility] 10/10/2013, 8:35 AM Pager: [phone]  If 7PM-7AM, please contact night-coverage [email or web address] Password [email or web address]  **Disclaimer: This note was dictated with voice recognition software. Similar sounding words can inadvertently be transcribed and this note may contain transcription errors which may not have been corrected upon publication of note.**

## 2013-10-10 NOTE — Progress Notes (Signed)
VASCULAR LAB  *Preliminary Results*  Bilateral lower extremity venous duplex completed. Bilateral lower extremities are negative for deep vein thrombosis. There is no evidence of Baker's cyst bilaterally.  ARTERIAL  ABI completed:    RIGHT    LEFT    PRESSURE WAVEFORM  PRESSURE WAVEFORM  BRACHIAL 128 Biphasic BRACHIAL 134 Triphasic   DP 67 Severely dampened monophasic DP 35 Dampened monophasic  AT   AT    PT 70 Monophasic PT 28 Monophasic  PER   PER    GREAT TOE  NA GREAT TOE  NA    RIGHT LEFT  ABI 0.52 0.26    The right ABI is suggestive of moderate, borderline severe arterial insufficiency. The left ABI is suggestive of severe arterial insufficiency.  10/10/2013 2:52 PM Gertie Fey, RVT, RDCS, RDMS

## 2013-10-10 NOTE — Progress Notes (Signed)
Placed on portable tele since batteries weak in telebox - notified RN on floor  - she will come down to monitor. Also notified her that neb was given for wheezing.

## 2013-10-10 NOTE — Progress Notes (Signed)
Patient: Jeffrey Melendez / Admit Date: 10/08/2013 / Date of Encounter: 10/10/2013, 4:48 PM   Subjective:  Still has intermittent chest pain and coughing.Overall feels better and asking when he can go home.Further history is the main reason he came to the hospital with severe leg pain due to his PAD. Chest pain was a secondary reason.  No wheezing pre-Lexiscan. With Lexiscan, developed 8/10 chest pain and wheezing/coughing that was transient. Symptoms gradually resolved over 7 minutes of test duration. Nursingbreathing treatment down in nuc to help alleviate bronchospasm. EKG LBBB with nonspecific ST-T changes.  Objective: Telemetry: NSR, did have episode of atrial fib ~3am on 8/16 Physical Exam: Blood pressure 115/83, pulse 72, temperature 97.5 F (36.4 C), temperature source Oral, resp. rate 22, height 5\' 6"  (1.676 m), weight 163 lb 5.8 oz (74.1 kg), SpO2 98.00%. General: Well developed chronically ill appearing WM in no acute distress. Neck: Negative for carotid bruits. JVP not elevated. Lungs: Diminished breath sounds throughout without wheezes, rales, or rhonchi. Breathing is unlabored. Heart: RRR distant heart sounds S1 S2 without murmurs, rubs, or gallops.  Abdomen: Soft, non-tender, non-distended with normoactive bowel sounds. No rebound/guarding.  Intake/Output Summary (Last 24 hours) at 10/10/13 1011 Last data filed at 10/10/13 0200  Gross per 24 hour  Intake 546.67 ml  Output   2100 ml  Net -1553.33 ml    Inpatient Medications:  . atorvastatin  80 mg Oral q1800  . cholecalciferol  2,000 Units Oral Daily  . citalopram  20 mg Oral Daily  . dipyridamole-aspirin  1 capsule Oral BID  . enoxaparin (LOVENOX) injection  40 mg Subcutaneous Q24H  . fluticasone  1 spray Each Nare Daily  . fluticasone  2 puff Inhalation BID  . folic acid  1 mg Oral Daily  . insulin aspart  0-15 Units Subcutaneous TID WC  . insulin aspart  0-5 Units Subcutaneous QHS  . insulin aspart  3 Units  Subcutaneous TID WC  . ipratropium-albuterol  3 mL Nebulization QID  . metoprolol succinate  50 mg Oral Daily  . multivitamin with minerals  1 tablet Oral Daily  . nicotine  21 mg Transdermal Daily  . omega-3 acid ethyl esters  1 g Oral Daily  . pantoprazole  40 mg Oral Daily  . polyvinyl alcohol  1 drop Both Eyes QID  . predniSONE  40 mg Oral Q breakfast  . thiamine  100 mg Oral Daily   Or  . thiamine  100 mg Intravenous Daily   Infusions:  . sodium chloride 10 mL/hr at 10/09/13 1150    Labs:  Recent Labs  10/09/13 0535 10/10/13 0341  NA 133* 138  K 4.1 4.0  CL 98 102  CO2 18* 26  GLUCOSE 248* 152*  BUN 8 13  CREATININE 0.90 0.82  CALCIUM 8.8 9.4    Recent Labs  10/08/13 2030  AST 31  ALT 32  ALKPHOS 45  BILITOT 0.2*  PROT 7.0  ALBUMIN 3.6    Recent Labs  10/08/13 2030 10/09/13 0535  WBC 7.4 7.1  NEUTROABS 3.3  --   HGB 12.0* 12.2*  HCT 33.1* 34.2*  MCV 97.6 98.0  PLT 209 176    Recent Labs  10/08/13 2311 10/09/13 0329 10/09/13 0955 10/09/13 1326  TROPONINI <0.30 <0.30 <0.30 <0.30     Recent Labs  10/09/13 0535  HGBA1C 6.1*     Radiology/Studies:  Dg Chest Port 1 View  10/08/2013   CLINICAL DATA:  BILATERAL leg pain, history of  deep venous thrombosis, COPD, MI, stroke, peripheral arterial disease, CHF, ischemic cardiomyopathy  EXAM: PORTABLE CHEST - 1 VIEW  COMPARISON:  Portable exam 2021 hr compared to 08/07/2013  FINDINGS: Upper normal heart size.  Sub pulmonary vascular congestion.  Mediastinal contours normal.  Diffuse accentuation of interstitial markings, slightly increased since previous exam question minimal edema.  No segmental consolidation, pleural effusion or pneumothorax.  Atherosclerotic calcification aortic arch.  No acute osseous findings.  IMPRESSION: Slight pulmonary vascular congestion with minimal diffuse interstitial prominence, question pulmonary edema.   Electronically Signed   By: Ulyses Southward M.D.   On: 10/08/2013  20:51     Assessment and Plan  1. Atypical chest pain in a patient with established CAD s/p MIs/PCI and LBBB and negative cardiac enzymes.  2. Chronic systolic CHF, felt to be mildly hypovolemic on arrival 3. History of LV thrombus, not currently on warfarin  4. H/o possible PAF, documented here by telemetry 8/16 at 3am 5. Severe PAD 6. Tobacco abuse 7. COPD exacerbation without wheezing 8. Alcohol abuse  Recommendations:  His myocardial perfusion scan today shows an ejection fraction of 40% with previous scar but no evidence of ischemia. His chest pain was atypical and he is nonischemic.  He is due to followup with his cardiologist at the [facility] on September 10. His main reason for coming to the hospital is leg pain. He had some atypical chest pain and I think at this time no further workup needs to be entertained. I last saw him 4 years ago and only during the hospitalization and had never seen him in the outpatient setting.  I think he may go home in the morning. Anticoagulation Issues inhim are complex and he at one time was on anticoagulation and later taken off of it.Since he is in sinus rhythm although he has multiple risk factors for Atrial fibrillation stroke risk, I would [facility] decide about the [facility] of anticoagulation.  Darden Palmer MD [facility]

## 2013-10-10 NOTE — Progress Notes (Signed)
Pt had a harsh mucous producing coughing episode with desat to 90% briefly.

## 2013-10-10 NOTE — Progress Notes (Signed)
Pt with high anxiety level. Continues to c/o pain L.E. @ shoulder. OOB in chair, good appetite. PT scheduled for tomorrow with pending d/c home.

## 2013-10-11 LAB — BASIC METABOLIC PANEL
ANION GAP: 14 (ref 5–15)
BUN: 13 mg/dL (ref 6–23)
CHLORIDE: 101 meq/L (ref 96–112)
CO2: 25 mEq/L (ref 19–32)
Calcium: 9.2 mg/dL (ref 8.4–10.5)
Creatinine, Ser: 0.77 mg/dL (ref 0.50–1.35)
GFR calc non Af Amer: >90 mL/min (ref >90)
Glucose, Bld: 128 mg/dL — ABNORMAL HIGH (ref 70–99)
POTASSIUM: 3.9 meq/L (ref 3.7–5.3)
Sodium: 140 mEq/L (ref 137–147)

## 2013-10-11 LAB — GLUCOSE, CAPILLARY: GLUCOSE-CAPILLARY: 107 mg/dL — AB (ref 70–99)

## 2013-10-11 MED ORDER — OXYCODONE HCL 5 MG PO TABS: 5.0000 mg | ORAL_TABLET | ORAL | Status: AC | PRN

## 2013-10-11 MED ORDER — CILOSTAZOL 50 MG PO TABS: 50.0000 mg | ORAL_TABLET | Freq: Two times a day (BID) | ORAL | Status: DC

## 2013-10-11 MED ORDER — FELODIPINE ER 5 MG PO TB24: 5.0000 mg | ORAL_TABLET | Freq: Every day | ORAL | Status: AC

## 2013-10-11 MED ORDER — FOLIC ACID 1 MG PO TABS: 1.0000 mg | ORAL_TABLET | Freq: Every day | ORAL | Status: AC

## 2013-10-11 MED ORDER — METOPROLOL SUCCINATE ER 50 MG PO TB24: 50.0000 mg | ORAL_TABLET | Freq: Every day | ORAL | Status: DC

## 2013-10-11 MED ORDER — INSULIN ASPART 100 UNIT/ML ~~LOC~~ SOLN: 5.0000 [IU] | Freq: Three times a day (TID) | SUBCUTANEOUS | Status: DC

## 2013-10-11 MED ORDER — NICOTINE 21 MG/24HR TD PT24: 21.0000 mg | MEDICATED_PATCH | Freq: Every day | TRANSDERMAL | Status: DC

## 2013-10-11 MED ORDER — FELODIPINE ER 5 MG PO TB24: 5.0000 mg | ORAL_TABLET | Freq: Every day | ORAL | Status: DC

## 2013-10-11 MED ORDER — FELODIPINE ER 5 MG PO TB24
5.0000 mg | ORAL_TABLET | Freq: Every day | ORAL | Status: DC
  Administered 2013-10-11: 5 mg via ORAL
  Filled 2013-10-11: qty 1

## 2013-10-11 MED ORDER — CILOSTAZOL 100 MG PO TABS
100.0000 mg | ORAL_TABLET | Freq: Two times a day (BID) | ORAL | Status: DC
  Filled 2013-10-11 (×3): qty 1

## 2013-10-11 MED ORDER — APIXABAN 5 MG PO TABS
5.0000 mg | ORAL_TABLET | Freq: Two times a day (BID) | ORAL | Status: DC
  Administered 2013-10-11: 5 mg via ORAL
  Filled 2013-10-11 (×2): qty 1

## 2013-10-11 MED ORDER — APIXABAN 5 MG PO TABS: 5.0000 mg | ORAL_TABLET | Freq: Two times a day (BID) | ORAL | Status: AC

## 2013-10-11 MED ORDER — PREDNISONE 10 MG PO TABS: ORAL_TABLET | ORAL | Status: DC

## 2013-10-11 NOTE — Progress Notes (Signed)
Inpatient Diabetes Program Recommendations  AACE/ADA: New Consensus Statement on Inpatient Glycemic Control (2013)  Target Ranges:  Prepandial:   less than 140 mg/dL      Peak postprandial:   less than 180 mg/dL (1-2 hours)      Critically ill patients:  140 - 180 mg/dL   Results for NASARIO, MASEK (MRN [id number]) as of 10/11/2013 09:13  Ref. Range 10/09/2013 20:10 10/09/2013 22:15 10/10/2013 16:28 10/10/2013 20:29 10/11/2013 07:54  Glucose-Capillary Latest Range: 70-99 mg/dL 638 (H) 466 (H) 599 (H) 305 (H) 107 (H)   Diabetes history: No Outpatient Diabetes medications: NA Current orders for Inpatient glycemic control: Novolog 0-15 units AC, Novolog 0-5 units HS, Novolog 3 units TID with meals  Inpatient Diabetes Program Recommendations Insulin - Meal Coverage: If steroids will be continued, please consider increasing meal coverage to Novolog 6 units TID with meals.  Thanks, Orlando Penner, RN, MSN, CCRN Diabetes Coordinator Inpatient Diabetes Program [phone] (Team Pager) [phone] (AP office) [phone] [facility] office)

## 2013-10-11 NOTE — Progress Notes (Signed)
Subjective:  Some left shoulder pain over night. No chest pain.  C/o leg pain  Objective:  Vital Signs in the last 24 hours: BP 135/71  Pulse 84  Temp(Src) 97.6 F (36.4 C) (Oral)  Resp 20  Ht 5\' 6"  (1.676 m)  Wt 74.1 kg (163 lb 5.8 oz)  BMI 26.38 kg/m2  SpO2 97%  Physical Exam: Mild obese WM in NAD Lungs:  Clear  Cardiac:  irregular rhythm, normal S1 and S2, no S3 Abdomen:  Soft, nontender, no masses Extremities:  No edema present  Intake/Output from previous day: 08/17 0701 - 08/18 0700 In: 680 [P.O.:680] Out: 750 [Urine:750] Weight Filed Weights   10/08/13 1928 10/09/13 0045 10/10/13 0600  Weight: 73.483 kg (162 lb) 77.2 kg (170 lb 3.1 oz) 74.1 kg (163 lb 5.8 oz)    Lab Results: Basic Metabolic Panel:  Recent Labs  01/75/10 0341 10/11/13 0312  NA 138 140  K 4.0 3.9  CL 102 101  CO2 26 25  GLUCOSE 152* 128*  BUN 13 13  CREATININE 0.82 0.77    CBC:  Recent Labs  10/08/13 2030 10/09/13 0535  WBC 7.4 7.1  NEUTROABS 3.3  --   HGB 12.0* 12.2*  HCT 33.1* 34.2*  MCV 97.6 98.0  PLT 209 176    BNP    Component Value Date/Time   PROBNP 547.1* 10/08/2013 2030    PROTIME: Lab Results  Component Value Date   INR 0.88 06/03/2009    Telemetry:  Currently atrial fibrillation .  Went into atrial fib at around 1 am and this persists  Assessment/Plan:  1. Paroxsymal atrial fibrillation CHADS2VASC score is 4  In past he has been on anticoagulant for LV mural thrombus and was stopped 6 months later.  [facility] so will need to arrange followup with them soon. May be best to take a NOAC to avoid protime fluctuations 2. CAD 3. Ischemic cardiomyopathy 4. Severe PAD  Rec:  Begin Eliquis 5 mg BID.  D/c Aggrenox.  Also may need to d/c cilostazol on Eliquis also..Continue metoprolol and [facility] with [facility].    Darden Palmer  MD [facility]  10/11/2013, 8:52 AM

## 2013-10-11 NOTE — Consult Note (Signed)
Asked by Hospitalist service to eval pt for left foot rest pain and claudication.  Pt with ABI 0.2 left and 0.5 right leg.  He has rest pain in left foot.  Offered pt further eval of this but he prefers to go to the [place].  Pt was given my contact information for follow up appt if he wishes.  Fabienne Bruns, MD [facility] Office: [phone] Pager: [phone]

## 2013-10-11 NOTE — Evaluation (Signed)
Physical Therapy Evaluation Patient Details Name: LOGYN CRAIG MRN: [id number] DOB: [birth year] Today's Date: 10/11/2013   History of Present Illness  Pt is a 68 y/o male presenting with severe BLE pain due to his PAD. Chest pain and SOB was a secondary reason for admission. Pt states that his LE pain has been present since 1996. Other significant PMH includes COPD, MI, CVA, CHF, CAD, DM, ischemic cardiomyopathy.  Clinical Impression  Pt admitted with the above. Pt currently with functional limitations due to the deficits listed below (see PT Problem List). At the time of PT eval pt ws able to perform mobility and transfers with supervision and occasional min guard. Pt reports that his LE pain has been present for 19 years and he knows how to negotiate his home well. Discussed use of RW for energy conservation, and pt agreeable to HHPT at d/c for strengthening and to increase tolerance for functional activity. Pt will benefit from skilled PT to increase their independence and safety with mobility to allow discharge to the venue listed below.       Follow Up Recommendations Home health PT;Supervision for mobility/OOB    Equipment Recommendations  None recommended by PT    Recommendations for Other Services       Precautions / Restrictions Precautions Precautions: Fall Restrictions Weight Bearing Restrictions: No      Mobility  Bed Mobility Overal bed mobility: Needs Assistance Bed Mobility: Sit to Supine       Sit to supine: Supervision   General bed mobility comments: VC's for sequencing and general safety awareness. Pt impulsive with movement and does not wait for cues from therapist.   Transfers Overall transfer level: Needs assistance Equipment used: Straight cane Transfers: Sit to/from Stand Sit to Stand: Supervision         General transfer comment: VC's for hand placement on seated surface for safety as well as for controlled descent to chair. No physical assist  required.  Ambulation/Gait Ambulation/Gait assistance: Min guard Ambulation Distance (Feet): 80 Feet Assistive device: Straight cane Gait Pattern/deviations: Decreased stride length;Shuffle;Trunk flexed;Narrow base of support Gait velocity: Decreased Gait velocity interpretation: Below normal speed for age/gender General Gait Details: Many standing rest breaks required due to DOE. Pt states he has always had trouble getting around ever since his LE's began hurting in 1996. Per pt and girlfriend, he is able to manage well at home with mobility. Today, occasional close guard for unsteadiness however no real physical assist required.   Stairs            Wheelchair Mobility    Modified Rankin (Stroke Patients Only)       Balance Overall balance assessment: Needs assistance Sitting-balance support: Feet supported;No upper extremity supported Sitting balance-Leahy Scale: Fair     Standing balance support: Single extremity supported;During functional activity Standing balance-Leahy Scale: Fair                               Pertinent Vitals/Pain Pain Assessment: 0-10 Pain Score: 6  Pain Location: BLE's Pain Descriptors / Indicators: Throbbing Pain Intervention(s): Premedicated before session    Home Living Family/patient expects to be discharged to:: Private residence Living Arrangements: Spouse/significant other Available Help at Discharge: Family;Available 24 hours/day Type of Home: Mobile home Home Access: Stairs to enter Entrance Stairs-Rails: Right;Left;Can reach both Entrance Stairs-Number of Steps: 6 Home Layout: One level Home Equipment: Walker - 2 wheels;Cane - single point  Prior Function Level of Independence: Independent with assistive device(s)         Comments: Uses cane all the time. Still driving, does the grocery shopping and cooking.      Hand Dominance   Dominant Hand: Right    Extremity/Trunk Assessment   Upper  Extremity Assessment: LUE deficits/detail       LUE Deficits / Details: Pt reports L shoulder pain of unknown etiology. Does not appear to have difficulty with transfers during session and does not appear to guard shoulder during mobility.    Lower Extremity Assessment: Generalized weakness      Cervical / Trunk Assessment: Normal  Communication   Communication: No difficulties  Cognition Arousal/Alertness: Awake/alert Behavior During Therapy: WFL for tasks assessed/performed Overall Cognitive Status: Within Functional Limits for tasks assessed                      General Comments      Exercises        Assessment/Plan    PT Assessment Patient needs continued PT services  PT Diagnosis Difficulty walking;Generalized weakness   PT Problem List Decreased strength;Decreased range of motion;Decreased activity tolerance;Decreased balance;Decreased mobility;Decreased knowledge of use of DME;Decreased safety awareness;Decreased knowledge of precautions;Cardiopulmonary status limiting activity  PT Treatment Interventions DME instruction;Gait training;Stair training;Functional mobility training;Therapeutic activities;Therapeutic exercise;Neuromuscular re-education;Patient/family education   PT Goals (Current goals can be found in the Care Plan section) Acute Rehab PT Goals Patient Stated Goal: To return home and be independent PT Goal Formulation: With patient/family Time For Goal Achievement: 10/18/13 Potential to Achieve Goals: Good    Frequency Min 3X/week   Barriers to discharge        Co-evaluation               End of Session Equipment Utilized During Treatment: Gait belt Activity Tolerance: Patient tolerated treatment well Patient left: in chair;with call bell/phone within reach;with family/visitor present Nurse Communication: Mobility status    Functional Assessment Tool Used: Clinical judgement Functional Limitation: Mobility: Walking and moving  around Mobility: Walking and Moving Around Current Status [phone](G8978): At least 20 percent but less than 40 percent impaired, limited or restricted Mobility: Walking and Moving Around Goal Status [phone](G8979): At least 20 percent but less than 40 percent impaired, limited or restricted    Time: 0958-1023 PT Time Calculation (min): 25 min   Charges:   PT Evaluation $Initial PT Evaluation Tier I: 1 Procedure PT Treatments $Gait Training: 8-22 mins   PT G Codes:   Functional Assessment Tool Used: Clinical judgement Functional Limitation: Mobility: Walking and moving around    SayreHamilton, Shandie Bertz 10/11/2013, 10:46 AM  Ruthann CancerLaura Hamilton, PT, DPT Acute Rehabilitation Services Pager: [phone]

## 2013-10-11 NOTE — Care Management Note (Signed)
    Page 1 of 1   10/11/2013     2:36:48 PM CARE MANAGEMENT NOTE 10/11/2013  Patient:  Jeffrey Melendez, Jeffrey Melendez   Account Number:  [id number]  Date Initiated:  10/11/2013  Documentation initiated by:  Taysean Wager  Subjective/Objective Assessment:   dx PAD/PVD; lives with spouse    PCP  Concha Pyo, [facility]     Action/Plan:   Anticipated DC Date:     Anticipated DC Plan:        DC Planning Services  CM consult      Choice offered to / List presented to:  C-1 Patient        HH arranged  HH-2 PT  HH-3 OT      [facility] agency  [facility]   Status of service:  Completed, signed off Medicare Important Message given?   (If response is "NO", the following Medicare IM given date fields will be blank) Date Medicare IM given:   Medicare IM given by:   Date Additional Medicare IM given:   Additional Medicare IM given by:    Discharge Disposition:  HOME W HOME HEALTH SERVICES  Per UR Regulation:  Reviewed for med. necessity/level of care/duration of stay  If discussed at Long Length of Stay Meetings, dates discussed:    Comments:  10/11/13 1335 Rhylei Mcquaig RN MSN BSN CCM  [facility] will provide home health PT/OT, documents faxed per request.  CMA determined Eliquis copay: PER REP AT [employer] PH [phone]/CO-PAY AT RETAIL $6.60/ NO AUTH REQUIRED/ PATIENT CAN USE ANY RETAIL PHARMACY.  Pt left hospital before CMA received information about copay and before CM could provide card for 30-day free trial - card mailed to pt.

## 2013-10-11 NOTE — Discharge Instructions (Signed)

## 2013-10-11 NOTE — Discharge Summary (Signed)
Physician Discharge Summary  Patient ID: Jeffrey Melendez MRN: [id number] DOB/AGE: [birth year] 68 y.o.  Admit date: 10/08/2013 Discharge date: 10/11/2013  Primary Care Physician:  Pcp Not In System  Discharge Diagnoses:    Severe peripheral vascular disease Paroxysmal atrial fibrillation Coronary artery disease with ischemic cardiomyopathy Chest pain resolved Acute COPD exacerbation Tobacco abuse  . Alcohol abuse with intoxication . Hyponatremia    Consults: Cardiology, Dr Donnie Ahoilley                   Vascular surgery: Dr Darrick PennaFields   Recommendations for Outpatient Follow-up:  Pt with ABI 0.2 left and 0.5 right leg. He has rest pain in left foot. Patient was offered vascular surgery evaluation here and was seen by Dr. Darrick PennaFields but patient [facility] to [facility]. Patient was given contact information with instructions to followup with Dr. Darrick PennaFields if he is unable to get early followup.  Allergies:   Allergies  Allergen Reactions  . Other     Med that started with a "G" for leg circulation-"goes against my heart"  . Wellbutrin [Bupropion Hcl] Other (See Comments)    Elevated BP  . Niacin And Related Rash     Discharge Medications:   Medication List    STOP taking these medications       amLODipine 10 MG tablet  Commonly known as:  NORVASC     aspirin 325 MG EC tablet     clopidogrel 75 MG tablet  Commonly known as:  PLAVIX     furosemide 40 MG tablet  Commonly known as:  LASIX     HYDROcodone-acetaminophen 5-325 MG per tablet  Commonly known as:  NORCO/VICODIN      TAKE these medications       ALPRAZolam 1 MG tablet  Commonly known as:  XANAX  Take 1 mg by mouth 3 (three) times daily as needed. For anxiety     apixaban 5 MG Tabs tablet  Commonly known as:  ELIQUIS  Take 1 tablet (5 mg total) by mouth 2 (two) times daily.     budesonide-formoterol 160-4.5 MCG/ACT inhaler  Commonly known as:  SYMBICORT  Inhale 2 puffs into the lungs 2 (two) times daily.      Cholecalciferol 2000 UNITS Tabs  Take 2,000 Units by mouth daily.     citalopram 40 MG tablet  Commonly known as:  CELEXA  Take 20 mg by mouth daily.     felodipine 5 MG 24 hr tablet  Commonly known as:  PLENDIL  Take 1 tablet (5 mg total) by mouth daily.     fish oil-omega-3 fatty acids 1000 MG capsule  Take 1 g by mouth daily.     FLUNISOLIDE (NASAL) NA  Place 2 sprays into both nostrils 2 (two) times daily as needed. For allergies     fluticasone 110 MCG/ACT inhaler  Commonly known as:  FLOVENT HFA  Inhale 2 puffs into the lungs 2 (two) times daily.     folic acid 1 MG tablet  Commonly known as:  FOLVITE  Take 1 tablet (1 mg total) by mouth daily.     hydrOXYzine 25 MG tablet  Commonly known as:  ATARAX/VISTARIL  - Take 25 mg by mouth 3 (three) times daily as needed. For anxiety  -      levalbuterol 45 MCG/ACT inhaler  Commonly known as:  XOPENEX HFA  Inhale 2 puffs into the lungs every 6 (six) hours as needed for wheezing or shortness of  breath.     lisinopril 10 MG tablet  Commonly known as:  PRINIVIL,ZESTRIL  Take 10 mg by mouth daily.     metoprolol succinate 50 MG 24 hr tablet  Commonly known as:  TOPROL-XL  Take 1 tablet (50 mg total) by mouth daily.     mometasone 220 MCG/INH inhaler  Commonly known as:  ASMANEX  Inhale 2 puffs into the lungs 2 (two) times daily.     multivitamin tablet  Take 1 tablet by mouth daily.     nicotine 21 mg/24hr patch  Commonly known as:  NICODERM CQ - dosed in mg/24 hours  Place 1 patch (21 mg total) onto the skin daily.     nitroGLYCERIN 0.4 mg/hr patch  Commonly known as:  NITRODUR - Dosed in mg/24 hr  Place 0.4 mg onto the skin daily.     nitroGLYCERIN 0.4 MG SL tablet  Commonly known as:  NITROSTAT  Place 0.4 mg under the tongue every 5 (five) minutes as needed. For chest pain     oxyCODONE 5 MG immediate release tablet  Commonly known as:  Oxy IR/ROXICODONE  Take 1 tablet (5 mg total) by mouth every 4  (four) hours as needed for moderate pain.     pantoprazole 40 MG tablet  Commonly known as:  PROTONIX  Take 40 mg by mouth daily.     potassium chloride 10 MEQ tablet  Commonly known as:  K-DUR  Take 2 tablets (20 mEq total) by mouth daily.     predniSONE 10 MG tablet  Commonly known as:  DELTASONE  Prednisone dosing: Take  Prednisone taper to 30mg  (3 tabs) x 3 days, then 20mg  (2 tabs) x 3days, then 10mg  (1 tab) x 3days, then OFF.     rosuvastatin 40 MG tablet  Commonly known as:  CRESTOR  Take 20 mg by mouth daily.     traZODone 100 MG tablet  Commonly known as:  DESYREL  Take 100 mg by mouth at bedtime as needed. For sleep         Brief H and P: For complete details please refer to admission H and P, but in briefRoy A Melendez is a 68 y.o. male with a history of COPD, Chronic Systolic CHF, CAD, Previous CVA, Hyperlipidemia, and PAD who presents to the ED at [facility] due to increased pain in both lower legs and worsening SOB with chest tightness. He reports having intermittent chest pain for 1 month. He denies any fevers or chills cough or hemoptysis. He was persuaded to go to the ED by his family this evening. He was found to have hypotension in the ED and an ETOH level of 222, and an initial troponin of <0.30. He was administered an IV bolus for his hypotension and IV Solumedrol for a COPD exacerbation and transferred to [facility] for admission   Hospital Course:  Chest pain: somewhat atypical, reported chest pain with shortness of breath, some pleuritic component in musculoskeletal, intermittent chest pains with rest for a month. High risk, prior history of coronary artery disease, patient reports he had 2 heart-attacks and 2 stents in [place], [place], has had stress tests in the past. History of hypertension, hyperlipidemia. Serial troponins were negative. 2-D echo showed EF of 30-35%, akinesis and scarring of the anteroseptal, anterior and apical myocardium, consistent with infarction in  the distribution of the LAD, severe hypokinesis and scarring of the entire inferior and anteroseptal myocardium, infarction in distribution of RCA. Grade 2 diastolic dysfunction.  Stress test was done which showed intermediate risk study, large apical scar with fixed inferior defect, no reversible ischemia, EF 40% apical akinesis and inferior hypokinesis. Patient was followed by cardiology through the hospitalization and recommended no further workup.   Atrial fibrillation, paroxysmal: CHADS score4, patient was noted to be in atrial fibrillation on 8/17 overnight patient was followed by Dr. Donnie Aho recommended that in past he had been on anticoagulants for LV mural thrombus and was stopped 6 month later, may be best to take a NOAC to avoid protime fluctuations patient was placed on Eliquis 5 mg BID. Aggrenox was discontinued. [facility] This was explained to the patient.    Systolic CHF, acute on chronic: EF 30-35%, prior EF unknown, has a history of known CAD, ischemic cardiomyopathy  Chest  x-ray did show slight pulmonary vascular congestion however Lactic acidosis at the time of admission, BNP 547  Lasix was initially held as patient was somewhat hypovolemic with lactic acidosis.  Tobacco abuse - Patient counseled on smoking cessation, placed on nicotine patch   Peripheral vascular disease; complaining of lower extremity pain and claudication. Doppler ultrasound of the lower extremities negative for DVT however vascular studies showed ABIs 0.2 left and 0.5 right leg. Vascular surgery was consulted. He has rest pain in left foot. Patient was offered further evaluation by vascular surgery, Dr. Darrick Penna but he prefers to go to the [place].  Patient was placed on Plendil.   Hyperglycemia/diabetes mellitus: Likely due to Solu-Medrol,  - Hemoglobin A1c 6.1, likely has diabetes mellitus, patient was not on any oral hypoglycemics prior to admission  -CBGs worsened due  to steroids, [facility] Steroids are being tapered hence blood sugars will improve.  COPD exacerbation: Improving, no wheezing continue bronchodilators, prednisone with taper  Alcohol abuse with intoxication, alcohol level 222 at the time of admission - Continued CIWA with Ativan protocol , patient had no acute alcohol withdrawal symptoms  Hyponatremia - improving      Day of Discharge BP 123/61  Pulse 79  Temp(Src) 97.6 F (36.4 C) (Oral)  Resp 20  Ht 5\' 6"  (1.676 m)  Wt 74.1 kg (163 lb 5.8 oz)  BMI 26.38 kg/m2  SpO2 99%  Physical Exam: General: Alert and awake oriented x3 not in any acute distress. CVS: Irregularly irregular Chest: clear to auscultation bilaterally, no wheezing rales or rhonchi Abdomen: soft nontender, nondistended, normal bowel sounds Extremities: no cyanosis, clubbing or edema noted bilaterally Neuro: Cranial nerves II-XII intact, no focal neurological deficits   The results of significant diagnostics from this hospitalization (including imaging, microbiology, ancillary and laboratory) are listed below for reference.    LAB RESULTS: Basic Metabolic Panel:  Recent Labs Lab 10/10/13 0341 10/11/13 0312  NA 138 140  K 4.0 3.9  CL 102 101  CO2 26 25  GLUCOSE 152* 128*  BUN 13 13  CREATININE 0.82 0.77  CALCIUM 9.4 9.2   Liver Function Tests:  Recent Labs Lab 10/08/13 2030  AST 31  ALT 32  ALKPHOS 45  BILITOT 0.2*  PROT 7.0  ALBUMIN 3.6   No results found for this basename: LIPASE, AMYLASE,  in the last 168 hours No results found for this basename: AMMONIA,  in the last 168 hours CBC:  Recent Labs Lab 10/08/13 2030 10/09/13 0535  WBC 7.4 7.1  NEUTROABS 3.3  --   HGB 12.0* 12.2*  HCT 33.1* 34.2*  MCV 97.6 98.0  PLT 209 176  Cardiac Enzymes:  Recent Labs Lab 10/09/13 0955 10/09/13 1326  TROPONINI <0.30 <0.30   BNP: No components found with this basename: POCBNP,  CBG:  Recent Labs Lab  10/10/13 2029 10/11/13 0754  GLUCAP 305* 107*    Significant Diagnostic Studies:  Dg Chest Port 1 View  10/08/2013   CLINICAL DATA:  BILATERAL leg pain, history of deep venous thrombosis, COPD, MI, stroke, peripheral arterial disease, CHF, ischemic cardiomyopathy  EXAM: PORTABLE CHEST - 1 VIEW  COMPARISON:  Portable exam 2021 hr compared to 08/07/2013  FINDINGS: Upper normal heart size.  Sub pulmonary vascular congestion.  Mediastinal contours normal.  Diffuse accentuation of interstitial markings, slightly increased since previous exam question minimal edema.  No segmental consolidation, pleural effusion or pneumothorax.  Atherosclerotic calcification aortic arch.  No acute osseous findings.  IMPRESSION: Slight pulmonary vascular congestion with minimal diffuse interstitial prominence, question pulmonary edema.   Electronically Signed   By: Ulyses Southward M.D.   On: 10/08/2013 20:51    2D ECHO: Study Conclusions  - Left ventricle: The cavity size was normal. Wall thickness was normal. Systolic function was moderately to severely reduced. The estimated ejection fraction was in the range of 30% to 35%. Akinesis and scarring of the anteroseptal, anterior, and apical myocardium; consistent with infarction in the distribution of the left anterior descending coronary artery. Severe hypokinesis and scarring of the entireinferior and inferoseptal myocardium; consistent with infarction in the distribution of the right coronary artery. Features are consistent with a pseudonormal left ventricular filling pattern, with concomitant abnormal relaxation and increased filling pressure (grade 2 diastolic dysfunction). Acoustic contrast opacification revealed no evidence ofthrombus. - Mitral valve: There was mild regurgitation. - Left atrium: The atrium was moderately dilated. - Atrial septum: No defect or patent foramen ovale was identified.    Disposition and Follow-up: Discharge Instructions       Increase activity slowly    Complete by:  As directed          DISPOSITION: Home  DIET: Carb modified diet   DISCHARGE FOLLOW-UP Follow-up Information   Schedule an appointment as soon as possible for a visit in 10 days to follow up. (for hospital follow-up)    Contact information:   [facility] [place]      Follow up with TILLEY JR,W SPENCER, MD. Schedule an appointment as soon as possible for a visit in 2 weeks. (for hospital follow-up)    Specialty:  Cardiology   Contact information:   [address] [place] [address] [place] [address] [phone]       Follow up with Sherren Kerns, MD. Schedule an appointment as soon as possible for a visit in 2 weeks. (for hospital follow-up, if you are not able to make an appointment with a vascular surgeon in [place])    Specialty:  Vascular Surgery   Contact information:   [address] [place] [address] [phone]       Time spent on Discharge: 45 mins  Signed:   RAI,RIPUDEEP M.D. [facility] 10/11/2013, 12:09 PM Pager: [phone]   **Disclaimer: This note was dictated with voice recognition software. Similar sounding words can inadvertently be transcribed and this note may contain transcription errors which may not have been corrected upon publication of note.**

## 2013-10-18 ENCOUNTER — Other Ambulatory Visit: Payer: Self-pay | Admitting: *Deleted

## 2013-10-18 DIAGNOSIS — I70229 Atherosclerosis of native arteries of extremities with rest pain, unspecified extremity: Secondary | ICD-10-CM

## 2013-10-18 DIAGNOSIS — I739 Peripheral vascular disease, unspecified: Secondary | ICD-10-CM

## 2013-10-19 ENCOUNTER — Ambulatory Visit (HOSPITAL_COMMUNITY)
Admission: RE | Admit: 2013-10-19 | Discharge: 2013-10-19 | Disposition: A | Discharge status: Home / Self Care | Payer: Medicare HMO | Source: Ambulatory Visit | Attending: Vascular Surgery | Admitting: Vascular Surgery

## 2013-10-19 ENCOUNTER — Other Ambulatory Visit: Payer: Self-pay

## 2013-10-19 ENCOUNTER — Encounter: Payer: Self-pay | Admitting: Vascular Surgery

## 2013-10-19 ENCOUNTER — Ambulatory Visit (INDEPENDENT_AMBULATORY_CARE_PROVIDER_SITE_OTHER): Payer: Medicare HMO | Admitting: Vascular Surgery

## 2013-10-19 VITALS — BP 127/73 | HR 62 | Resp 18 | Ht 66.5 in | Wt 159.0 lb

## 2013-10-19 DIAGNOSIS — M79605 Pain in left leg: Secondary | ICD-10-CM

## 2013-10-19 DIAGNOSIS — I739 Peripheral vascular disease, unspecified: Secondary | ICD-10-CM | POA: Insufficient documentation

## 2013-10-19 DIAGNOSIS — M79609 Pain in unspecified limb: Secondary | ICD-10-CM

## 2013-10-19 DIAGNOSIS — M79604 Pain in right leg: Secondary | ICD-10-CM

## 2013-10-19 DIAGNOSIS — I70229 Atherosclerosis of native arteries of extremities with rest pain, unspecified extremity: Secondary | ICD-10-CM | POA: Diagnosis not present

## 2013-10-20 ENCOUNTER — Other Ambulatory Visit (HOSPITAL_COMMUNITY): Payer: Medicare Other

## 2013-10-20 ENCOUNTER — Encounter: Payer: Medicare Other | Admitting: Vascular Surgery

## 2013-10-20 NOTE — Progress Notes (Signed)
[facility] [place] HISTORY AND PHYSICAL   History of Present Illness:  Patient is a 68 y.o. year old male who presents for evaluation of rest pain left foot.  This has been present for a couple of months.  The [facility] but got [facility] of waiting for an appoint.  He also has pain in his left calf and sometimes his right which occurs at 1 block.  He has atrial fibrillation and is on Eliquis.  He is also on aspirin.  Other medical problems include COPD, CAD, diabetes hyperlipidemia which are all stable.  He is currently smoking and greater than 3 minutes spent today on tobacco cessation.  Past Medical History  Diagnosis Date  . COPD (chronic obstructive pulmonary disease)   . Myocardial infarction   . Stroke   . PAD (peripheral artery disease)   . CHF (congestive heart failure)     systolic  . Hyperlipidemia   . Anxiety   . Ischemic cardiomyopathy   . Coronary artery disease   . Diabetes mellitus without complication     Past Surgical History  Procedure Laterality Date  . Coronary stent placement      x 5  . Arm surgery    . Foot surgery    . Head surgery    . Facial fracture surgery    . Knee surgery    . Cardiac catheterization  09/2008  . Skin graft to right arm    . Fracture surgery      Social History History  Substance Use Topics  . Smoking status: Current Every Day Smoker -- 1.00 packs/day for 52 years    Types: Cigarettes  . Smokeless tobacco: Never Used  . Alcohol Use: 1.8 oz/week    3 Cans of beer per week    Family History No family history on file.  Allergies  Allergies  Allergen Reactions  . Other     Med that started with a "G" for leg circulation-"goes against my heart"  . Wellbutrin [Bupropion Hcl] Other (See Comments)    Elevated BP  . Niacin And Related Rash     Current Outpatient Prescriptions  Medication Sig Dispense Refill  . ALPRAZolam (XANAX) 1 MG tablet  Take 1 mg by mouth 3 (three) times daily as needed. For anxiety       . apixaban (ELIQUIS) 5 MG TABS tablet Take 1 tablet (5 mg total) by mouth 2 (two) times daily.  60 tablet  3  . budesonide-formoterol (SYMBICORT) 160-4.5 MCG/ACT inhaler Inhale 2 puffs into the lungs 2 (two) times daily.      . Cholecalciferol 2000 UNITS TABS Take 2,000 Units by mouth daily.       . citalopram (CELEXA) 40 MG tablet Take 20 mg by mouth daily.        . felodipine (PLENDIL) 5 MG 24 hr tablet Take 1 tablet (5 mg total) by mouth daily.  30 tablet  4  . fish oil-omega-3 fatty acids 1000 MG capsule Take 1 g by mouth daily.        . FLUNISOLIDE, NASAL, NA Place 2 sprays into both nostrils 2 (two) times daily as needed. For allergies       . fluticasone (FLOVENT HFA) 110 MCG/ACT inhaler Inhale 2 puffs into the lungs 2 (two) times daily.      . folic acid (FOLVITE) 1 MG tablet Take 1 tablet (1 mg total) by mouth daily.    30 tablet  3  . hydrOXYzine (ATARAX/VISTARIL) 25 MG tablet Take 25 mg by mouth 3 (three) times daily as needed. For anxiety        . lisinopril (PRINIVIL,ZESTRIL) 10 MG tablet Take 10 mg by mouth daily.        . metoprolol succinate (TOPROL-XL) 50 MG 24 hr tablet Take 1 tablet (50 mg total) by mouth daily.  60 tablet  3  . mometasone (ASMANEX) 220 MCG/INH inhaler Inhale 2 puffs into the lungs 2 (two) times daily.        . Multiple Vitamin (MULTIVITAMIN) tablet Take 1 tablet by mouth daily.        . nicotine (NICODERM CQ - DOSED IN MG/24 HOURS) 21 mg/24hr patch Place 1 patch (21 mg total) onto the skin daily.  30 patch  2  . nitroGLYCERIN (NITRODUR - DOSED IN MG/24 HR) 0.4 mg/hr patch Place 0.4 mg onto the skin daily.      . nitroGLYCERIN (NITROSTAT) 0.4 MG SL tablet Place 0.4 mg under the tongue every 5 (five) minutes as needed. For chest pain       . oxyCODONE (OXY IR/ROXICODONE) 5 MG immediate release tablet Take 1 tablet (5 mg total) by mouth every 4 (four) hours as needed for moderate pain.  30 tablet   0  . pantoprazole (PROTONIX) 40 MG tablet Take 40 mg by mouth daily.      . predniSONE (DELTASONE) 10 MG tablet Prednisone dosing: Take  Prednisone taper to  (3 tabs) x 3 days, then  (2 tabs) x 3days, then  (1 tab) x 3days, then OFF.  18 tablet  0  . rosuvastatin (CRESTOR) 40 MG tablet Take 20 mg by mouth daily.       . traZODone (DESYREL) 100 MG tablet Take 100 mg by mouth at bedtime as needed. For sleep       . levalbuterol (XOPENEX HFA) 45 MCG/ACT inhaler Inhale 2 puffs into the lungs every 6 (six) hours as needed for wheezing or shortness of breath.  1 Inhaler  3  . potassium chloride (K-DUR) 10 MEQ tablet Take 2 tablets (20 mEq total) by mouth daily.  60 tablet  0   No current facility-administered medications for this visit.    ROS:   General:  No weight loss, Fever, chills  HEENT: No recent headaches, no nasal bleeding, no visual changes, no sore throat  Neurologic: No dizziness, blackouts, seizures. No recent symptoms of stroke or mini- stroke. No recent episodes of slurred speech, or temporary blindness.  Cardiac: No recent episodes of chest pain/pressure, no shortness of breath at rest.  + shortness of breath with exertion.  Denies history of atrial fibrillation or irregular heartbeat  Vascular: + history of rest pain in feet.  + history of claudication.  No history of non-healing ulcer, No history of DVT   Pulmonary: No home oxygen, no productive cough, no hemoptysis,  No asthma or wheezing  Musculoskeletal:   Arthritis,  Low back pain,   Joint pain  Hematologic:No history of hypercoagulable state.  No history of easy bleeding.  No history of anemia  Gastrointestinal: No hematochezia or melena,  No gastroesophageal reflux, no trouble swallowing  Urinary:  chronic Kidney disease,  on HD -  MWF or  TTHS,  Burning with urination,  Frequent urination,  Difficulty urinating;   Skin: No rashes  Psychological: + history of anxiety,  No  history of depression  Physical Examination  Filed Vitals:   10/19/13 1438  BP: 127/73  Pulse: 62  Resp: 18  Height: 5' 6.5" (1.689 m)  Weight: 159 lb (72.122 kg)    Body mass index is 25.28 kg/(m^2).  General:  Alert and oriented, no acute distress HEENT: Normal Neck: No bruit or JVD Pulmonary: Clear to auscultation bilaterally Cardiac: Regular Rate and Rhythm without murmur Abdomen: Soft, non-tender, non-distended, no mass Skin: No rash, no ulcer Extremity Pulses:  2+ femoral,absent popliteal dorsalis pedis, posterior tibial pulses bilaterally Musculoskeletal: No deformity or edema  Neurologic: Upper and lower extremity motor 5/5 and symmetric  DATA:  Arterial duplex today reviewed and interpreted left SFA occluded, left tibial disease, left common femoral, right SFA, ABI from [facility] dated 8/17 left 0.3, right 0.5   ASSESSMENT: Rest pain left foot with bilateral claudication. At risk for limb loss.   PLAN:  Stop smoking.  Aortogram with bilat runoff possible intervention 10/28/13.  Stop eliquis 9/1.  He will need cardiac risk stratification preop if he requires bypass.  He saw Dr Donnie Aho while in the hospital  Fabienne Bruns, MD [facility] [place] Office: [phone] Pager: [phone]

## 2013-10-26 ENCOUNTER — Encounter (HOSPITAL_COMMUNITY): Payer: Self-pay | Admitting: Pharmacy Technician

## 2013-10-28 ENCOUNTER — Encounter (HOSPITAL_COMMUNITY)
Admission: RE | Disposition: A | Discharge status: Home / Self Care | Payer: Self-pay | Source: Ambulatory Visit | Attending: Vascular Surgery

## 2013-10-28 ENCOUNTER — Telehealth: Payer: Self-pay | Admitting: Vascular Surgery

## 2013-10-28 ENCOUNTER — Other Ambulatory Visit: Payer: Self-pay | Admitting: *Deleted

## 2013-10-28 ENCOUNTER — Ambulatory Visit (HOSPITAL_COMMUNITY)
Admission: RE | Admit: 2013-10-28 | Discharge: 2013-10-28 | Disposition: A | Discharge status: Home / Self Care | Payer: Medicare HMO | Source: Ambulatory Visit | Attending: Vascular Surgery | Admitting: Vascular Surgery

## 2013-10-28 DIAGNOSIS — J449 Chronic obstructive pulmonary disease, unspecified: Secondary | ICD-10-CM | POA: Insufficient documentation

## 2013-10-28 DIAGNOSIS — I2510 Atherosclerotic heart disease of native coronary artery without angina pectoris: Secondary | ICD-10-CM | POA: Diagnosis not present

## 2013-10-28 DIAGNOSIS — I509 Heart failure, unspecified: Secondary | ICD-10-CM | POA: Diagnosis not present

## 2013-10-28 DIAGNOSIS — I2589 Other forms of chronic ischemic heart disease: Secondary | ICD-10-CM | POA: Diagnosis not present

## 2013-10-28 DIAGNOSIS — IMO0002 Reserved for concepts with insufficient information to code with codable children: Secondary | ICD-10-CM | POA: Diagnosis not present

## 2013-10-28 DIAGNOSIS — Z9861 Coronary angioplasty status: Secondary | ICD-10-CM | POA: Diagnosis not present

## 2013-10-28 DIAGNOSIS — E119 Type 2 diabetes mellitus without complications: Secondary | ICD-10-CM | POA: Diagnosis not present

## 2013-10-28 DIAGNOSIS — E785 Hyperlipidemia, unspecified: Secondary | ICD-10-CM | POA: Insufficient documentation

## 2013-10-28 DIAGNOSIS — I252 Old myocardial infarction: Secondary | ICD-10-CM | POA: Diagnosis not present

## 2013-10-28 DIAGNOSIS — F17200 Nicotine dependence, unspecified, uncomplicated: Secondary | ICD-10-CM | POA: Insufficient documentation

## 2013-10-28 DIAGNOSIS — J4489 Other specified chronic obstructive pulmonary disease: Secondary | ICD-10-CM | POA: Insufficient documentation

## 2013-10-28 DIAGNOSIS — I739 Peripheral vascular disease, unspecified: Secondary | ICD-10-CM

## 2013-10-28 DIAGNOSIS — Z01810 Encounter for preprocedural cardiovascular examination: Secondary | ICD-10-CM

## 2013-10-28 DIAGNOSIS — I4891 Unspecified atrial fibrillation: Secondary | ICD-10-CM | POA: Diagnosis not present

## 2013-10-28 DIAGNOSIS — I70229 Atherosclerosis of native arteries of extremities with rest pain, unspecified extremity: Secondary | ICD-10-CM | POA: Insufficient documentation

## 2013-10-28 DIAGNOSIS — Z7901 Long term (current) use of anticoagulants: Secondary | ICD-10-CM | POA: Insufficient documentation

## 2013-10-28 DIAGNOSIS — I5022 Chronic systolic (congestive) heart failure: Secondary | ICD-10-CM | POA: Diagnosis not present

## 2013-10-28 HISTORY — PX: ABDOMINAL AORTAGRAM: SHX5454

## 2013-10-28 LAB — POCT I-STAT, CHEM 8
BUN: 8 mg/dL (ref 6–23)
CALCIUM ION: 1.17 mmol/L (ref 1.13–1.30)
CHLORIDE: 103 meq/L (ref 96–112)
Creatinine, Ser: 0.8 mg/dL (ref 0.50–1.35)
Glucose, Bld: 120 mg/dL — ABNORMAL HIGH (ref 70–99)
HEMATOCRIT: 41 % (ref 39.0–52.0)
Hemoglobin: 13.9 g/dL (ref 13.0–17.0)
Potassium: 4 mEq/L (ref 3.7–5.3)
Sodium: 138 mEq/L (ref 137–147)
TCO2: 24 mmol/L (ref 0–100)

## 2013-10-28 LAB — GLUCOSE, CAPILLARY
GLUCOSE-CAPILLARY: 135 mg/dL — AB (ref 70–99)
Glucose-Capillary: 95 mg/dL (ref 70–99)

## 2013-10-28 SURGERY — ABDOMINAL AORTAGRAM
Anesthesia: LOCAL

## 2013-10-28 MED ORDER — SODIUM CHLORIDE 0.9 % IV SOLN
INTRAVENOUS | Status: DC
  Administered 2013-10-28: 10:00:00 via INTRAVENOUS

## 2013-10-28 MED ORDER — ACETAMINOPHEN 325 MG RE SUPP
325.0000 mg | RECTAL | Status: DC | PRN
  Filled 2013-10-28: qty 2

## 2013-10-28 MED ORDER — ACETAMINOPHEN 325 MG PO TABS
325.0000 mg | ORAL_TABLET | ORAL | Status: DC | PRN
  Filled 2013-10-28: qty 2

## 2013-10-28 MED ORDER — METOPROLOL TARTRATE 1 MG/ML IV SOLN: 2.0000 mg | INTRAVENOUS | Status: DC | PRN

## 2013-10-28 MED ORDER — FENTANYL CITRATE 0.05 MG/ML IJ SOLN
INTRAMUSCULAR | Status: AC
  Filled 2013-10-28: qty 2

## 2013-10-28 MED ORDER — HYDRALAZINE HCL 20 MG/ML IJ SOLN: 10.0000 mg | INTRAMUSCULAR | Status: DC | PRN

## 2013-10-28 MED ORDER — MORPHINE SULFATE 10 MG/ML IJ SOLN
2.0000 mg | INTRAMUSCULAR | Status: DC | PRN
  Administered 2013-10-28: 2 mg via INTRAVENOUS

## 2013-10-28 MED ORDER — LABETALOL HCL 5 MG/ML IV SOLN: 10.0000 mg | INTRAVENOUS | Status: DC | PRN

## 2013-10-28 MED ORDER — LIDOCAINE HCL (PF) 1 % IJ SOLN
INTRAMUSCULAR | Status: AC
  Filled 2013-10-28: qty 30

## 2013-10-28 MED ORDER — MORPHINE SULFATE 2 MG/ML IJ SOLN
INTRAMUSCULAR | Status: AC
  Filled 2013-10-28: qty 1

## 2013-10-28 MED ORDER — SODIUM CHLORIDE 0.45 % IV SOLN
INTRAVENOUS | Status: DC
  Administered 2013-10-28: 13:00:00 via INTRAVENOUS

## 2013-10-28 MED ORDER — ONDANSETRON HCL 4 MG/2ML IJ SOLN: 4.0000 mg | Freq: Four times a day (QID) | INTRAMUSCULAR | Status: DC | PRN

## 2013-10-28 MED ORDER — HEPARIN (PORCINE) IN NACL 2-0.9 UNIT/ML-% IJ SOLN
INTRAMUSCULAR | Status: AC
  Filled 2013-10-28: qty 1000

## 2013-10-28 NOTE — Interval H&P Note (Signed)
History and Physical Interval Note:  10/28/2013 10:27 AM  Jeffrey Melendez  has presented today for surgery, with the diagnosis of pvd  The various methods of treatment have been discussed with the patient and family. After consideration of risks, benefits and other options for treatment, the patient has consented to  Procedure(s): ABDOMINAL AORTAGRAM (N/A) as a surgical intervention .  The patient's history has been reviewed, patient examined, no change in status, stable for surgery.  I have reviewed the patient's chart and labs.  Questions were answered to the patient's satisfaction.     Kelsi Benham E

## 2013-10-28 NOTE — Interval H&P Note (Signed)
History and Physical Interval Note:  10/28/2013 9:00 AM  Jeffrey Melendez  has presented today for surgery, with the diagnosis of pvd  The various methods of treatment have been discussed with the patient and family. After consideration of risks, benefits and other options for treatment, the patient has consented to  Procedure(s): ABDOMINAL AORTAGRAM (N/A) as a surgical intervention .  The patient's history has been reviewed, patient examined, no change in status, stable for surgery.  I have reviewed the patient's chart and labs.  Questions were answered to the patient's satisfaction.     Marjorie Deprey E

## 2013-10-28 NOTE — H&P (View-Only) (Signed)
[facility] [place] HISTORY AND PHYSICAL   History of Present Illness:  Patient is a 68 y.o. year old male who presents for evaluation of rest pain left foot.  This has been present for a couple of months.  The patient was initially going to have this evaluated at the [facility] hospital but got tired of waiting for an appoint.  He also has pain in his left calf and sometimes his right which occurs at 1 block.  He has atrial fibrillation and is on Eliquis.  He is also on aspirin.  Other medical problems include COPD, CAD, diabetes hyperlipidemia which are all stable.  He is currently smoking and greater than 3 minutes spent today on tobacco cessation.  Past Medical History  Diagnosis Date  . COPD (chronic obstructive pulmonary disease)   . Myocardial infarction   . Stroke   . PAD (peripheral artery disease)   . CHF (congestive heart failure)     systolic  . Hyperlipidemia   . Anxiety   . Ischemic cardiomyopathy   . Coronary artery disease   . Diabetes mellitus without complication     Past Surgical History  Procedure Laterality Date  . Coronary stent placement      x 5  . Arm surgery    . Foot surgery    . Head surgery    . Facial fracture surgery    . Knee surgery    . Cardiac catheterization  09/2008  . Skin graft to right arm    . Fracture surgery      Social History History  Substance Use Topics  . Smoking status: Current Every Day Smoker -- 1.00 packs/day for 52 years    Types: Cigarettes  . Smokeless tobacco: Never Used  . Alcohol Use: 1.8 oz/week    3 Cans of beer per week    Family History No family history on file.  Allergies  Allergies  Allergen Reactions  . Other     Med that started with a "G" for leg circulation-"goes against my heart"  . Wellbutrin [Bupropion Hcl] Other (See Comments)    Elevated BP  . Niacin And Related Rash     Current Outpatient Prescriptions  Medication Sig Dispense Refill  . ALPRAZolam (XANAX) 1 MG tablet  Take 1 mg by mouth 3 (three) times daily as needed. For anxiety       . apixaban (ELIQUIS) 5 MG TABS tablet Take 1 tablet (5 mg total) by mouth 2 (two) times daily.  60 tablet  3  . budesonide-formoterol (SYMBICORT) 160-4.5 MCG/ACT inhaler Inhale 2 puffs into the lungs 2 (two) times daily.      . Cholecalciferol 2000 UNITS TABS Take 2,000 Units by mouth daily.       . citalopram (CELEXA) 40 MG tablet Take 20 mg by mouth daily.        . felodipine (PLENDIL) 5 MG 24 hr tablet Take 1 tablet (5 mg total) by mouth daily.  30 tablet  4  . fish oil-omega-3 fatty acids 1000 MG capsule Take 1 g by mouth daily.        Marland Kitchen FLUNISOLIDE, NASAL, NA Place 2 sprays into both nostrils 2 (two) times daily as needed. For allergies       . fluticasone (FLOVENT HFA) 110 MCG/ACT inhaler Inhale 2 puffs into the lungs 2 (two) times daily.      . folic acid (FOLVITE) 1 MG tablet Take 1 tablet (1 mg total) by mouth daily.  30 tablet  3  . hydrOXYzine (ATARAX/VISTARIL) 25 MG tablet Take 25 mg by mouth 3 (three) times daily as needed. For anxiety        . lisinopril (PRINIVIL,ZESTRIL) 10 MG tablet Take 10 mg by mouth daily.        . metoprolol succinate (TOPROL-XL) 50 MG 24 hr tablet Take 1 tablet (50 mg total) by mouth daily.  60 tablet  3  . mometasone (ASMANEX) 220 MCG/INH inhaler Inhale 2 puffs into the lungs 2 (two) times daily.        . Multiple Vitamin (MULTIVITAMIN) tablet Take 1 tablet by mouth daily.        . nicotine (NICODERM CQ - DOSED IN MG/24 HOURS) 21 mg/24hr patch Place 1 patch (21 mg total) onto the skin daily.  30 patch  2  . nitroGLYCERIN (NITRODUR - DOSED IN MG/24 HR) 0.4 mg/hr patch Place 0.4 mg onto the skin daily.      . nitroGLYCERIN (NITROSTAT) 0.4 MG SL tablet Place 0.4 mg under the tongue every 5 (five) minutes as needed. For chest pain       . oxyCODONE (OXY IR/ROXICODONE) 5 MG immediate release tablet Take 1 tablet (5 mg total) by mouth every 4 (four) hours as needed for moderate pain.  30 tablet   0  . pantoprazole (PROTONIX) 40 MG tablet Take 40 mg by mouth daily.      . predniSONE (DELTASONE) 10 MG tablet Prednisone dosing: Take  Prednisone taper to  (3 tabs) x 3 days, then  (2 tabs) x 3days, then  (1 tab) x 3days, then OFF.  18 tablet  0  . rosuvastatin (CRESTOR) 40 MG tablet Take 20 mg by mouth daily.       . traZODone (DESYREL) 100 MG tablet Take 100 mg by mouth at bedtime as needed. For sleep       . levalbuterol (XOPENEX HFA) 45 MCG/ACT inhaler Inhale 2 puffs into the lungs every 6 (six) hours as needed for wheezing or shortness of breath.  1 Inhaler  3  . potassium chloride (K-DUR) 10 MEQ tablet Take 2 tablets (20 mEq total) by mouth daily.  60 tablet  0   No current facility-administered medications for this visit.    ROS:   General:  No weight loss, Fever, chills  HEENT: No recent headaches, no nasal bleeding, no visual changes, no sore throat  Neurologic: No dizziness, blackouts, seizures. No recent symptoms of stroke or mini- stroke. No recent episodes of slurred speech, or temporary blindness.  Cardiac: No recent episodes of chest pain/pressure, no shortness of breath at rest.  + shortness of breath with exertion.  Denies history of atrial fibrillation or irregular heartbeat  Vascular: + history of rest pain in feet.  + history of claudication.  No history of non-healing ulcer, No history of DVT   Pulmonary: No home oxygen, no productive cough, no hemoptysis,  No asthma or wheezing  Musculoskeletal:   Arthritis,  Low back pain,   Joint pain  Hematologic:No history of hypercoagulable state.  No history of easy bleeding.  No history of anemia  Gastrointestinal: No hematochezia or melena,  No gastroesophageal reflux, no trouble swallowing  Urinary:  chronic Kidney disease,  on HD -  MWF or  TTHS,  Burning with urination,  Frequent urination,  Difficulty urinating;   Skin: No rashes  Psychological: + history of anxiety,  No  history of depression  Physical Examination  Filed Vitals:   10/19/13 1438  BP: 127/73  Pulse: 62  Resp: 18  Height: 5' 6.5" (1.689 m)  Weight: 159 lb (72.122 kg)    Body mass index is 25.28 kg/(m^2).  General:  Alert and oriented, no acute distress HEENT: Normal Neck: No bruit or JVD Pulmonary: Clear to auscultation bilaterally Cardiac: Regular Rate and Rhythm without murmur Abdomen: Soft, non-tender, non-distended, no mass Skin: No rash, no ulcer Extremity Pulses:  2+ femoral,absent popliteal dorsalis pedis, posterior tibial pulses bilaterally Musculoskeletal: No deformity or edema  Neurologic: Upper and lower extremity motor 5/5 and symmetric  DATA:  Arterial duplex today reviewed and interpreted left SFA occluded, left tibial disease, left common femoral, right SFA, ABI from [facility] dated 8/17 left 0.3, right 0.5   ASSESSMENT: Rest pain left foot with bilateral claudication. At risk for limb loss.   PLAN:  Stop smoking.  Aortogram with bilat runoff possible intervention 10/28/13.  Stop eliquis 9/1.  He will need cardiac risk stratification preop if he requires bypass.  He saw Dr Donnie Aho while in the hospital  Fabienne Bruns, MD [facility] [place] Office: [phone] Pager: [phone]

## 2013-10-28 NOTE — Discharge Instructions (Addendum)
DO NOT TAKE YOUR ELIQUIS TODAY.  IT IS OK TO RESUME YOUR ELIQUIS TOMORROW.  Angiogram, Care After  Refer to this sheet in the next few weeks. These instructions provide you with information on caring for yourself after your procedure. Your health care provider may also give you more specific instructions. Your treatment has been planned according to current medical practices, but problems sometimes occur. Call your health care provider if you have any problems or questions after your procedure.  WHAT TO EXPECT AFTER THE PROCEDURE After your procedure, it is typical to have the following sensations:  Minor discomfort or tenderness and a small bump at the catheter insertion site. The bump should usually decrease in size and tenderness within 1 to 2 weeks.  Any bruising will usually fade within 2 to 4 weeks. HOME CARE INSTRUCTIONS   You may need to keep taking blood thinners if they were prescribed for you. Take medicines only as directed by your health care provider.  Do not apply powder or lotion to the site.  Do not take baths, swim, or use a hot tub until your health care provider approves.  You may shower 24 hours after the procedure. Remove the bandage (dressing) and gently wash the site with plain soap and water. Gently pat the site dry.  Inspect the site at least twice daily.  Limit your activity for the first 24 hours. Do not bend, squat, or lift anything over 10 lb (9 kg) or as directed by your health care provider.  Plan to have someone take you home after the procedure. Follow instructions about when you can drive or return to work. SEEK MEDICAL CARE IF:  You get light-headed when standing up.  You have drainage (other than a small amount of blood on the dressing).  You have chills.  You have a fever.  You have redness, warmth, swelling, or pain at the insertion site. SEEK IMMEDIATE MEDICAL CARE IF:   You develop chest pain or shortness of breath, feel faint, or pass  out.  You have bleeding, swelling larger than a walnut, or drainage from the catheter insertion site.  You develop pain, discoloration, coldness, or severe bruising in the leg or arm that held the catheter.  You have heavy bleeding from the site. If this happens, hold pressure on the site. MAKE SURE YOU:  Understand these instructions.  Will watch your condition.  Will get help right away if you are not doing well or get worse. Document Released: 08/29/2004 Document Revised: 06/27/2013 Document Reviewed: 07/05/2012 [facility] Patient Information 2015 [place], [place]. This information is not intended to replace advice given to you by your health care provider. Make sure you discuss any questions you have with your health care provider.

## 2013-10-28 NOTE — Telephone Encounter (Signed)
Dr Tawana Scale office is already closed for the day. I will call when our office returns on 11/01/13, dpm

## 2013-10-28 NOTE — Telephone Encounter (Signed)
Message copied by Fredrich Birks on Fri Oct 28, 2013  4:38 PM ------      Message from: Sharee Pimple      Created: Fri Oct 28, 2013  3:19 PM      Regarding: Schedule                   ----- Message -----         From: Barnett Hatter         Sent: 10/28/2013   2:20 PM           To: Barnett Hatter, [facility] Charge Pool      Subject: Tiburcio Bash                                                              ----- Message -----         From: Sherren Kerns, MD         Sent: 10/28/2013  12:10 PM           To: [facility] Charge Pool            Aortogram with bilat runoff      Ultrasound            He needs appt with Dr Donnie Aho for preop cardiac eval            He needs bilateral lower extremity vein map and office visit with me after seeing Donnie Aho to pick an OR date and decide when to stop his Eliquis            Fabienne Bruns ------

## 2013-10-28 NOTE — Op Note (Signed)
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 The patient was placed in supine position the Angio table. Both groins were prepped and draped in usual sterile fashion. Local anesthesia was infiltrated over the right common femoral artery. Ultrasound used to identify the right common femoral artery. An introducer needle was used to cannulate the right common femoral artery. Initially there was good backflow but the guidewire would not advance. Guidewire and needle were removed and hemostasis obtained with direct pressure for 2 minutes. An additional temp was performed and I was able successfully cannulate the right common femoral artery and an 035 Versed or wire threaded up in the abdominal aorta under fluoroscopic guidance. Next a 5 French sheath placed over the guidewire the right common femoral artery. This thoroughly flushed with heparinized saline. 5 French pigtail catheter was then placed over the guidewire up in the abdominal aorta. Abdominal aortogram was obtained. Left and right renal arteries are patent. The infrarenal abdominal aorta is patent and tapers toward the hand. There is 25-30% origin stenosis the right common iliac artery. The left common iliac artery is patent. The left and right external and internal iliac arteries are patent. Next the pigtail catheter was pulled down distal to the aortic bifurcation and AP and oblique views the  pelvis were obtained to confirm the above findings.  Next bilateral lower extremity runoff views were obtained.  In the left lower extremity, the left common femoral artery is patent. The left profunda femoris artery is patent. The left superficial femoral artery is occluded as well as the popliteal artery. There is reconstitution of the tibial vessels via profunda collaterals. The anterior tibial posterior tibial and peroneal arteries are all patent and do reconstitute just below the takeoff in the below-knee popliteal artery.  In the right lower extremity, the right common femoral artery is patent. The right profunda femoris artery is patent. The right superficial femoral artery is occluded in it's mid section. The below-knee popliteal artery is patent. There is three-vessel runoff to the right foot.  At this point the pigtail catheter was removed over a guidewire. A 5 French sheath was left in place to be pulled in the holding area. The patient tolerated the procedure well and there were complications. Patient in the holding area in stable condition.  Operative management: The patient will be scheduled in the near future for a left femoral to below-knee popliteal or tibioperoneal trunk bypass. He will undergo cardiac risk stratification by Dr. Donnie Aho preoperatively. He will also undergo bilateral lower extremity vein mapping. We will schedule his bypass after the above are completed. At that time we will determine the stop his Eliquis preoperatively.  Fabienne Bruns, MD [facility] [place] Office: [phone] Pager: [phone]

## 2013-10-28 NOTE — Progress Notes (Signed)
Site area: 0 Site Prior to Removal:  Level 0 Pressure Applied For: 20 ninutes Manual:   yes Patient Status During Pull:  stable Post Pull Site:  Level 0 Post Pull Instructions Given:  yes Post Pull Pulses Present: yes Dressing Applied:  yes Bedrest begins @ 1250 Comments: no complications

## 2013-11-01 NOTE — Telephone Encounter (Signed)
Dr York Spaniel next available appointment is 11/18/13- asking [facility] if this date is ok. Waiting for OK before contacting pt, dpm

## 2013-11-01 NOTE — Telephone Encounter (Signed)
Spoke with Cappi at Dr Tawana Scale office. She will need to have an appt cleared by Dr Donnie Aho before scheduling, she will call me back once she has spoken with him, dpm

## 2013-11-02 NOTE — Telephone Encounter (Signed)
When I called Jeffrey Melendez to give him the appointment with Dr Donnie Aho, he informed me that he no longer sees Donnie Aho, he is being seen at the [facility] for his heart care. He stated that he would take care of getting his cardiac clearance from them to our office- asked that I just cancel [facility] appointment.   [facility] and would let us know more after that time. I informed him of his follow up appointment with Dr Darrick Penna on 11/24/13.

## 2013-11-02 NOTE — Telephone Encounter (Signed)
Message copied by Fredrich Birks on Wed Nov 02, 2013 11:42 AM ------      Message from: Sherren Kerns      Created: Tue Nov 01, 2013  4:25 PM      Regarding: RE: Cardiac Eval       9/25 is ok            Leonette Most      ----- Message -----         From: Fredrich Birks         Sent: 11/01/2013   4:05 PM           To: Sherren Kerns, MD      Subject: Cardiac Eval                                             Dr Darrick Penna,            I have contacted Dr Tawana Scale office to schedule cardiac clearance. Dr Donnie Aho will be out of the country next week and can not see Mr Romano until 11/18/13. Is this acceptable, or would you like for me to check with [facility]?                  Thanks,      Annabelle Harman       ------

## 2013-11-24 ENCOUNTER — Encounter (HOSPITAL_COMMUNITY): Payer: Medicare HMO

## 2013-11-24 ENCOUNTER — Ambulatory Visit: Payer: Medicare HMO | Admitting: Vascular Surgery

## 2013-12-21 ENCOUNTER — Encounter: Payer: Self-pay | Admitting: Vascular Surgery

## 2013-12-22 ENCOUNTER — Ambulatory Visit (INDEPENDENT_AMBULATORY_CARE_PROVIDER_SITE_OTHER): Payer: Medicare HMO | Admitting: Vascular Surgery

## 2013-12-22 ENCOUNTER — Encounter: Payer: Self-pay | Admitting: Vascular Surgery

## 2013-12-22 ENCOUNTER — Ambulatory Visit (HOSPITAL_COMMUNITY)
Admission: RE | Admit: 2013-12-22 | Discharge: 2013-12-22 | Disposition: A | Discharge status: Home / Self Care | Payer: Medicare HMO | Source: Ambulatory Visit | Attending: Vascular Surgery | Admitting: Vascular Surgery

## 2013-12-22 VITALS — BP 139/79 | HR 69 | Ht 66.5 in | Wt 165.2 lb

## 2013-12-22 DIAGNOSIS — I739 Peripheral vascular disease, unspecified: Secondary | ICD-10-CM

## 2013-12-22 DIAGNOSIS — Z1810 Retained metal fragments, unspecified: Secondary | ICD-10-CM | POA: Diagnosis not present

## 2013-12-22 DIAGNOSIS — Z01810 Encounter for preprocedural cardiovascular examination: Secondary | ICD-10-CM | POA: Diagnosis present

## 2013-12-22 DIAGNOSIS — Z720 Tobacco use: Secondary | ICD-10-CM

## 2013-12-22 NOTE — Progress Notes (Signed)
[facility] [place] HISTORY AND PHYSICAL    History of Present Illness:  Patient is a 68 y.o. year old male who presents for evaluation of rest pain left foot.  This has been present for a couple of months.  The patient was initially going to have this [facility] at the [facility] but got tired of waiting for an appoint.  He also has pain in his left calf and sometimes his right which occurs at 1 block.  He has atrial fibrillation and is on Eliquis.  He is also on aspirin.  he recently underwent a lower extremity arteriogram on September 4. 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 Other medical problems include COPD, CAD, diabetes hyperlipidemia which are all stable.  he had [facility] on October 5 which showed an ejection fraction of 27% with a large chronic infarct but no reversible defects. He is currently smoking and greater than 3 minutes spent today on tobacco cessation. He has cut back to one half pack per day. I emphasized to him that durability of any vascular intervention would be limited by his tobacco abuse.    Past Medical History   Diagnosis  Date   .  COPD (chronic obstructive pulmonary disease)     .  Myocardial infarction     .  Stroke     .  PAD (peripheral artery disease)     .  CHF (congestive heart failure)         systolic   .  Hyperlipidemia     .  Anxiety     .  Ischemic cardiomyopathy     .  Coronary artery disease     .  Diabetes mellitus without complication         Past Surgical History   Procedure  Laterality  Date   .  Coronary stent placement           x 5   .  Arm surgery       .  Foot surgery       .  Head surgery       .  Facial fracture surgery       .  Knee surgery       .  Cardiac catheterization    09/2008   .  Skin graft to right arm       .  Fracture surgery         Social History History   Substance Use Topics   .  Smoking status:   Current Every Day Smoker -- 1.00 packs/day for 52 years       Types:  Cigarettes   .  Smokeless tobacco:  Never Used   .  Alcohol Use:  1.8 oz/week       3 Cans of beer per week     Family History No family history on file.  Allergies    Allergies   Allergen  Reactions   .  Other         Med that started with a "G" for leg circulation-"goes against my heart"   .  Wellbutrin [Bupropion Hcl]  Other (See Comments)       Elevated BP   .  Niacin And Related  Rash    Current Outpatient Prescriptions on File Prior to Visit  Medication Sig Dispense Refill  . albuterol (PROVENTIL HFA;VENTOLIN HFA) 108 (90 BASE) MCG/ACT  inhaler Inhale 2 puffs into the lungs 4 (four) times daily as needed for wheezing or shortness of breath.      Marland Kitchen. albuterol (PROVENTIL) (2.5 MG/3ML) 0.083% nebulizer solution Take 2.5 mg by nebulization 4 (four) times daily.      Marland Kitchen. ALPRAZolam (XANAX) 1 MG tablet Take 1 mg by mouth 3 (three) times daily as needed for anxiety.       Marland Kitchen. amLODipine (NORVASC) 5 MG tablet Take 5 mg by mouth daily.      Marland Kitchen. apixaban (ELIQUIS) 5 MG TABS tablet Take 1 tablet (5 mg total) by mouth 2 (two) times daily.  60 tablet  3  . aspirin EC 81 MG tablet Take 81 mg by mouth daily.      . budesonide-formoterol (SYMBICORT) 160-4.5 MCG/ACT inhaler Inhale 1 puff into the lungs 2 (two) times daily.       . cholecalciferol (VITAMIN D) 1000 UNITS tablet Take 1,000 Units by mouth 2 (two) times daily.      . citalopram (CELEXA) 40 MG tablet Take 20 mg by mouth daily.        . felodipine (PLENDIL) 5 MG 24 hr tablet Take 1 tablet (5 mg total) by mouth daily.  30 tablet  4  . fluocinonide (LIDEX) 0.05 % external solution Apply 1 application topically 4 (four) times a week.      . fluticasone (FLONASE) 50 MCG/ACT nasal spray Place 2 sprays into both nostrils 2 (two) times daily.      . fluticasone (FLOVENT HFA) 110 MCG/ACT inhaler Inhale 2 puffs into the lungs 2 (two) times daily.      . folic acid (FOLVITE) 1 MG  tablet Take 1 tablet (1 mg total) by mouth daily.  30 tablet  3  . glyBURIDE (DIABETA) 1.25 MG tablet Take 1.25 mg by mouth 2 (two) times daily.      Marland Kitchen. HYDROcodone-acetaminophen (NORCO/VICODIN) 5-325 MG per tablet Take 1 tablet by mouth 3 (three) times daily as needed for moderate pain.      . hydrocortisone 2.5 % lotion Apply 1 application topically 3 (three) times daily.      . hydrOXYzine (ATARAX/VISTARIL) 25 MG tablet Take 25 mg by mouth 3 (three) times daily as needed for anxiety.       Marland Kitchen. ketoconazole (NIZORAL) 2 % shampoo Apply 1 application topically 3 (three) times a week.      Marland Kitchen. lisinopril (PRINIVIL,ZESTRIL) 10 MG tablet Take 10 mg by mouth daily.        . metoprolol succinate (TOPROL-XL) 100 MG 24 hr tablet Take 100 mg by mouth daily. Take with or immediately following a meal.      . mometasone (ASMANEX) 220 MCG/INH inhaler Inhale 2 puffs into the lungs 2 (two) times daily.        . Multiple Vitamin (MULTIVITAMIN) tablet Take 1 tablet by mouth daily.        . nitroGLYCERIN (NITRODUR - DOSED IN MG/24 HR) 0.4 mg/hr patch Place 0.4 mg onto the skin daily.      . nitroGLYCERIN (NITROSTAT) 0.4 MG SL tablet Place 0.4 mg under the tongue every 5 (five) minutes as needed. For chest pain       . Omega-3 Fatty Acids (FISH OIL) 1000 MG CAPS Take 1,000 mg by mouth 2 (two) times daily.      Marland Kitchen. oxyCODONE (OXY IR/ROXICODONE) 5 MG immediate release tablet Take 1 tablet (5 mg total) by mouth every 4 (four) hours as needed for moderate pain.  30 tablet  0  . pantoprazole (PROTONIX) 40 MG tablet Take 40 mg by mouth daily.      . potassium chloride (K-DUR,KLOR-CON) 10 MEQ tablet Take 10 mEq by mouth daily.      . rosuvastatin (CRESTOR) 40 MG tablet Take 20 mg by mouth daily.       . tadalafil (CIALIS) 5 MG tablet Take 5 mg by mouth daily as needed for erectile dysfunction.       . Thiamine HCl (THIAMINE PO) Take 1 tablet by mouth daily.      . traZODone (DESYREL) 100 MG tablet Take 100 mg by mouth at bedtime  as needed for sleep.       . vitamin E (VITAMIN E) 400 UNIT capsule Take 400 Units by mouth daily.       No current facility-administered medications on file prior to visit.    ROS:    General:  No weight loss, Fever, chills  HEENT: No recent headaches, no nasal bleeding, no visual changes, no sore throat  Neurologic: No dizziness, blackouts, seizures. No recent symptoms of stroke or mini- stroke. No recent episodes of slurred speech, or temporary blindness.  Cardiac: No recent episodes of chest pain/pressure, no shortness of breath at rest.  + shortness of breath with exertion.  Denies history of atrial fibrillation or irregular heartbeat  Vascular: + history of rest pain in feet.  + history of claudication.  No history of non-healing ulcer, No history of DVT    Pulmonary: No home oxygen, no productive cough, no hemoptysis,  No asthma or wheezing  Musculoskeletal:  [ ]  Arthritis, [ ]  Low back pain,  [ ]  Joint pain  Hematologic:No history of hypercoagulable state.  No history of easy bleeding.  No history of anemia  Gastrointestinal: No hematochezia or melena,  No gastroesophageal reflux, no trouble swallowing  Urinary: [ ]  chronic Kidney disease, [ ]  on HD - [ ]  MWF or [ ]  TTHS, [ ]  Burning with urination, [ ]  Frequent urination, [ ]  Difficulty urinating;    Skin: No rashes  Psychological: + history of anxiety,  No history of depression   Physical Examination     Filed Vitals:   12/22/13 0900  BP: 139/79  Pulse: 69  Height: 5' 6.5" (1.689 m)  Weight: 165 lb 3.2 oz (74.934 kg)  SpO2: 100%   General:  Alert and oriented, no acute distress HEENT: Normal Neck: No bruit or JVD Pulmonary: Clear to auscultation bilaterally Cardiac: Regular Rate and Rhythm without murmur Abdomen: Soft, non-tender, non-distended, no mass Skin: No rash, no ulcer Extremity Pulses:  2+ femoral,absent popliteal dorsalis pedis, posterior tibial pulses bilaterally Musculoskeletal: No deformity  or edema     Neurologic: Upper and lower extremity motor 5/5 and symmetric  DATA:   ABI from [facility] dated 8/17 left 0.3, right 0.5.  Vein mapping ultrasound today showed left greater saphenous vein 3-5 mm above the knee 2.5-3 mm below the knee   ASSESSMENT: Rest pain left foot with bilateral claudication. At risk for limb loss.   PLAN:  Stop smoking.  we will stop his Ellik was on 12/29/2013. He is scheduled for left femoral to below-knee popliteal bypass on November 9. Fabienne Bruns, MD [facility] [place] Office: [phone] Pager: [phone]

## 2013-12-23 ENCOUNTER — Encounter (HOSPITAL_COMMUNITY): Payer: Self-pay | Admitting: Pharmacy Technician

## 2013-12-23 ENCOUNTER — Other Ambulatory Visit: Payer: Self-pay

## 2013-12-29 ENCOUNTER — Encounter (HOSPITAL_COMMUNITY)
Admission: RE | Admit: 2013-12-29 | Discharge: 2013-12-29 | Disposition: A | Discharge status: Home / Self Care | Payer: Medicare HMO | Source: Ambulatory Visit | Attending: Vascular Surgery | Admitting: Vascular Surgery

## 2013-12-29 ENCOUNTER — Encounter (HOSPITAL_COMMUNITY): Payer: Self-pay

## 2013-12-29 DIAGNOSIS — I70222 Atherosclerosis of native arteries of extremities with rest pain, left leg: Secondary | ICD-10-CM | POA: Diagnosis not present

## 2013-12-29 DIAGNOSIS — I70213 Atherosclerosis of native arteries of extremities with intermittent claudication, bilateral legs: Secondary | ICD-10-CM | POA: Insufficient documentation

## 2013-12-29 DIAGNOSIS — Z01812 Encounter for preprocedural laboratory examination: Secondary | ICD-10-CM | POA: Diagnosis not present

## 2013-12-29 HISTORY — DX: Gastro-esophageal reflux disease without esophagitis: K21.9

## 2013-12-29 HISTORY — DX: Depression, unspecified: F32.A

## 2013-12-29 HISTORY — DX: Essential (primary) hypertension: I10

## 2013-12-29 HISTORY — DX: Cardiac arrhythmia, unspecified: I49.9

## 2013-12-29 HISTORY — DX: Pneumonia, unspecified organism: J18.9

## 2013-12-29 HISTORY — DX: Headache, unspecified: R51.9

## 2013-12-29 HISTORY — DX: Unspecified atrial fibrillation: I48.91

## 2013-12-29 HISTORY — DX: Unspecified asthma, uncomplicated: J45.909

## 2013-12-29 HISTORY — DX: Paroxysmal atrial fibrillation: I48.0

## 2013-12-29 HISTORY — DX: Unspecified osteoarthritis, unspecified site: M19.90

## 2013-12-29 HISTORY — DX: Headache: R51

## 2013-12-29 HISTORY — DX: Unspecified cataract: H26.9

## 2013-12-29 HISTORY — DX: Major depressive disorder, single episode, unspecified: F32.9

## 2013-12-29 LAB — URINALYSIS, ROUTINE W REFLEX MICROSCOPIC
BILIRUBIN URINE: NEGATIVE
GLUCOSE, UA: NEGATIVE mg/dL
HGB URINE DIPSTICK: NEGATIVE
Ketones, ur: NEGATIVE mg/dL
Leukocytes, UA: NEGATIVE
Nitrite: NEGATIVE
PH: 5.5 (ref 5.0–8.0)
Protein, ur: NEGATIVE mg/dL
SPECIFIC GRAVITY, URINE: 1.01 (ref 1.005–1.030)
UROBILINOGEN UA: 0.2 mg/dL (ref 0.0–1.0)

## 2013-12-29 LAB — CBC
HCT: 38.4 % — ABNORMAL LOW (ref 39.0–52.0)
Hemoglobin: 13.9 g/dL (ref 13.0–17.0)
MCH: 34.8 pg — ABNORMAL HIGH (ref 26.0–34.0)
MCHC: 36.2 g/dL — ABNORMAL HIGH (ref 30.0–36.0)
MCV: 96 fL (ref 78.0–100.0)
PLATELETS: 200 10*3/uL (ref 150–400)
RBC: 4 MIL/uL — ABNORMAL LOW (ref 4.22–5.81)
RDW: 12.7 % (ref 11.5–15.5)
WBC: 7.4 10*3/uL (ref 4.0–10.5)

## 2013-12-29 LAB — PROTIME-INR
INR: 1.07 (ref 0.00–1.49)
PROTHROMBIN TIME: 14 s (ref 11.6–15.2)

## 2013-12-29 LAB — APTT: APTT: 28 s (ref 24–37)

## 2013-12-29 LAB — COMPREHENSIVE METABOLIC PANEL
ALK PHOS: 62 U/L (ref 39–117)
ALT: 25 U/L (ref 0–53)
AST: 29 U/L (ref 0–37)
Albumin: 3.6 g/dL (ref 3.5–5.2)
Anion gap: 13 (ref 5–15)
BUN: 11 mg/dL (ref 6–23)
CHLORIDE: 101 meq/L (ref 96–112)
CO2: 24 meq/L (ref 19–32)
CREATININE: 0.86 mg/dL (ref 0.50–1.35)
Calcium: 10 mg/dL (ref 8.4–10.5)
GFR, EST NON AFRICAN AMERICAN: 88 mL/min — AB (ref >90)
GLUCOSE: 66 mg/dL — AB (ref 70–99)
POTASSIUM: 4.9 meq/L (ref 3.7–5.3)
Sodium: 138 mEq/L (ref 137–147)
Total Bilirubin: 0.2 mg/dL — ABNORMAL LOW (ref 0.3–1.2)
Total Protein: 7.3 g/dL (ref 6.0–8.3)

## 2013-12-29 LAB — ABO/RH: ABO/RH(D): B POS

## 2013-12-29 LAB — SURGICAL PCR SCREEN
MRSA, PCR: NEGATIVE
Staphylococcus aureus: NEGATIVE

## 2013-12-29 LAB — TYPE AND SCREEN
ABO/RH(D): B POS
ANTIBODY SCREEN: NEGATIVE

## 2013-12-29 NOTE — Progress Notes (Signed)
Pt denies SOB and chest pain. Pt stated that he is under the care of a new cardiologist at the [facility] in [facility]; pt forgot name of cardiologists. The patient stated that his last dose of Eliquis was Wednesday, 12/28/13 as per MD instructions. Revonda Standard, PA ( anesthesia) made aware of pt cardiac history and abnormal blood glucose result

## 2013-12-29 NOTE — Progress Notes (Signed)
Anesthesia chart review:  Patient is a 68 year old male scheduled for left FPBG on 01/02/14 by Dr. Darrick Penna.   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  Cardiologist is thru the [facility]. He was seen by local cardiologists Dr. Royann Shivers and Dr. Donnie Aho in 09/2013 during an admission for chest pain and leg pain, ruled out MI. Had intermediate risk but non-ischemic stress test with plans for out-patient follow-up with his primary cardiologist.  He was seen by Dr. Wille Glaser last on 12/20/13 for preoperative clearance and felt to be intermediate risk from a cardiac standpoint for FPBG.  No further preoperative cardiac testing recommended with recommendation to minimize fluid resuscitation during surgery once cleared due to low LVEF.  Permission to hold Eliquis preoperatively if needed but recommended continuing ASA 81 mg if okay with surgeon.    EKG on 12/20/13 [facility]): afib @ 71 bpm, rightward axis, cannot rule out anterior infarct (old), non-specific ST/T wave changes, non-specific IVCD.    Exercise nuclear stress test 11/28/13 [facility]): There is a large fixed defect involving the anterior septal, septal, inferior walls consistent with a large chronic infarct. No evidence of reversible defects. Ejection fraction is markedly below normal at 27%. There is left ventricular dilatation.  Nuclear stress test on 10/10/13 [facility]):  Intermediate risk study. Large apical scar with fixed inferior defect. No reversible ischemia. LVEF 40%, apical akinesis and inferior hypokinesis.  Echo on 10/09/13: - Left ventricle: The cavity size was normal. Wall thickness was normal. Systolic function was moderately to severely reduced. The estimated ejection fraction was in the range of 30%  to 35%. Akinesis and scarring of the anteroseptal, anterior, and apical myocardium; consistent with infarction in the distribution of the left anterior descending coronary artery. Severe hypokinesis and scarring of the entireinferior and inferoseptal myocardium consistent with infarction in the distribution of the right coronary artery. Features are consistent with a pseudonormal left ventricular filling pattern, with concomitant abnormal relaxation and increased filling pressure (grade 2 diastolic dysfunction). Acoustic contrast opacification revealed no evidence of thrombus. - Mitral valve: There was mild regurgitation. - Left atrium: The atrium was moderately dilated. - Atrial septum: No defect or patent foramen ovale was identified.  According to Dr. Leana Roe note, cardiac cath in 09/2008 showed 50% mid LAD, patent RCA stents.  Carotid duplex 12/06/08 [facility]): No evidence for hemodynamically significant ICA stenosis. Atherosclerosis.  Preoperative labs noted.  He will get a fasting CBG on arrival.    If no acute changes then I would anticipate that he can proceed as planned.  Velna Ochs [facility] Short Stay Center/Anesthesiology Phone [phone] 12/29/2013 5:56 PM

## 2013-12-29 NOTE — Progress Notes (Signed)
   12/29/13 1305  OBSTRUCTIVE SLEEP APNEA  Have you ever been diagnosed with sleep apnea through a sleep study? No  Do you snore loudly (loud enough to be heard through closed doors)?  1  Do you often feel tired, fatigued, or sleepy during the daytime? 1  Has anyone observed you stop breathing during your sleep? 0  Do you have, or are you being treated for high blood pressure? 1  BMI more than 35 kg/m2? 0  Age over 67 years old? 1  Neck circumference greater than 40 cm/16 inches? 0  Gender: 1  Obstructive Sleep Apnea Score 5

## 2013-12-29 NOTE — Pre-Procedure Instructions (Signed)
Jeffrey Melendez  12/29/2013   Your procedure is scheduled on: Monday, January 02, 2014  Report to [facility] Admitting at 5:30 AM.  Call this number if you have problems the morning of surgery: [phone]   Remember:   Do not eat food or drink liquids after midnight Sunday, January 01, 2014   Take these medicines the morning of surgery with A SIP OF WATER: amLODipine (NORVASC) , aspirin, citalopram (CELEXA), felodipine (PLENDIL) , metoprolol (TOPROL-XL), pantoprazole (PROTONIX), budesonide-formoterol (SYMBICORT) inhaler, fluticasone (FLONASE) nasal spray, fluticasone (FLOVENT) inhaler, mometasone (ASMANEX) inhaler, nitroGLYCERIN (NITRODUR - DOSED IN MG/24 HR) 0.4 mg/hr patch,   if needed:pain medication, nitroGLYCERIN (NITROSTAT) for chest pain, ALPRAZolam (XANAX) for anxiety, albuterol (PROVENTIL HFA;VENTOLIN ) inhaler for wheezing or shortness of breath ( Bring inhaler with you on day of surgery).  DO NOT TAKE ANY DIABETIC MEDICATIONS THE MORNING OF SURGERY.  Stop taking vitamins and herbal medications such as Omega-3 Fatty Acids (FISH OIL) and vitamin E .  Do not take any NSAIDs ie: Ibuprofen, Advil, Naproxen and etc.   Do not wear jewelry, make-up or nail polish.  Do not wear lotions, powders, or perfumes. You may not wear deodorant.  Do not shave 48 hours prior to surgery. Men may shave face and neck.  Do not bring valuables to the hospital.  [facility] is not responsible for any belongings or valuables.               Contacts, dentures or bridgework may not be worn into surgery.  Leave suitcase in the car. After surgery it may be brought to your room.  For patients admitted to the hospital, discharge time is determined by your treatment team.               Patients discharged the day of surgery will not be allowed to drive home.  Name and phone number of your driver:   Special Instructions: Special Instructions:Special Instructions: [facility] - Preparing for  Surgery  Before surgery, you can play an important role.  Because skin is not sterile, your skin needs to be as free of germs as possible.  You can reduce the number of germs on you skin by washing with CHG (chlorahexidine gluconate) soap before surgery.  CHG is an antiseptic cleaner which kills germs and bonds with the skin to continue killing germs even after washing.  Please DO NOT use if you have an allergy to CHG or antibacterial soaps.  If your skin becomes reddened/irritated stop using the CHG and inform your nurse when you arrive at Short Stay.  Do not shave (including legs and underarms) for at least 48 hours prior to the first CHG shower.  You may shave your face.  Please follow these instructions carefully:   1.  Shower with CHG Soap the night before surgery and the morning of Surgery.  2.  If you choose to wash your hair, wash your hair first as usual with your normal shampoo.  3.  After you shampoo, rinse your hair and body thoroughly to remove the Shampoo.  4.  Use CHG as you would any other liquid soap.  You can apply chg directly  to the skin and wash gently with scrungie or a clean washcloth.  5.  Apply the CHG Soap to your body ONLY FROM THE NECK DOWN.  Do not use on open wounds or open sores.  Avoid contact with your eyes, ears, mouth and genitals (private parts).  Wash genitals (  private parts) with your normal soap.  6.  Wash thoroughly, paying special attention to the area where your surgery will be performed.  7.  Thoroughly rinse your body with warm water from the neck down.  8.  DO NOT shower/wash with your normal soap after using and rinsing off the CHG Soap.  9.  Pat yourself dry with a clean towel.            10.  Wear clean pajamas.            11.  Place clean sheets on your bed the night of your first shower and do not sleep with pets.  Day of Surgery  Do not apply any lotions/deodorants the morning of surgery.  Please wear clean clothes to the hospital/surgery  center.   Please read over the following fact sheets that you were given: Pain Booklet, Coughing and Deep Breathing, Blood Transfusion Information, MRSA Information and Surgical Site Infection Prevention

## 2014-01-01 MED ORDER — DEXTROSE 5 % IV SOLN
1.5000 g | INTRAVENOUS | Status: AC
  Administered 2014-01-02 (×2): 1.5 g via INTRAVENOUS
  Filled 2014-01-01: qty 1.5

## 2014-01-01 MED ORDER — SODIUM CHLORIDE 0.9 % IV SOLN: INTRAVENOUS | Status: DC

## 2014-01-01 MED ORDER — CHLORHEXIDINE GLUCONATE CLOTH 2 % EX PADS
6.0000 | MEDICATED_PAD | Freq: Once | CUTANEOUS | Status: DC
Start: 2014-01-01 — End: 2014-01-02

## 2014-01-01 MED ORDER — CHLORHEXIDINE GLUCONATE CLOTH 2 % EX PADS: 6.0000 | MEDICATED_PAD | Freq: Once | CUTANEOUS | Status: DC

## 2014-01-02 ENCOUNTER — Inpatient Hospital Stay (HOSPITAL_COMMUNITY): Payer: Medicare HMO | Admitting: Certified Registered Nurse Anesthetist

## 2014-01-02 ENCOUNTER — Inpatient Hospital Stay (HOSPITAL_COMMUNITY): Payer: Medicare HMO

## 2014-01-02 ENCOUNTER — Inpatient Hospital Stay (HOSPITAL_COMMUNITY)
Admission: RE | Admit: 2014-01-02 | Discharge: 2014-01-05 | DRG: 253 | Disposition: A | Discharge status: Home Health | Payer: Medicare HMO | Source: Ambulatory Visit | Attending: Vascular Surgery | Admitting: Vascular Surgery

## 2014-01-02 ENCOUNTER — Encounter (HOSPITAL_COMMUNITY): Payer: Self-pay | Admitting: Surgery

## 2014-01-02 ENCOUNTER — Inpatient Hospital Stay (HOSPITAL_COMMUNITY): Payer: Medicare HMO | Admitting: Vascular Surgery

## 2014-01-02 ENCOUNTER — Encounter (HOSPITAL_COMMUNITY)
Admission: RE | Disposition: A | Discharge status: Home Health | Payer: Self-pay | Source: Ambulatory Visit | Attending: Vascular Surgery

## 2014-01-02 DIAGNOSIS — I70222 Atherosclerosis of native arteries of extremities with rest pain, left leg: Secondary | ICD-10-CM | POA: Diagnosis present

## 2014-01-02 DIAGNOSIS — Z955 Presence of coronary angioplasty implant and graft: Secondary | ICD-10-CM | POA: Diagnosis not present

## 2014-01-02 DIAGNOSIS — Z8673 Personal history of transient ischemic attack (TIA), and cerebral infarction without residual deficits: Secondary | ICD-10-CM | POA: Diagnosis not present

## 2014-01-02 DIAGNOSIS — F17210 Nicotine dependence, cigarettes, uncomplicated: Secondary | ICD-10-CM | POA: Diagnosis present

## 2014-01-02 DIAGNOSIS — I2510 Atherosclerotic heart disease of native coronary artery without angina pectoris: Secondary | ICD-10-CM | POA: Diagnosis present

## 2014-01-02 DIAGNOSIS — E1169 Type 2 diabetes mellitus with other specified complication: Secondary | ICD-10-CM | POA: Diagnosis present

## 2014-01-02 DIAGNOSIS — I70212 Atherosclerosis of native arteries of extremities with intermittent claudication, left leg: Secondary | ICD-10-CM | POA: Diagnosis present

## 2014-01-02 DIAGNOSIS — E785 Hyperlipidemia, unspecified: Secondary | ICD-10-CM | POA: Diagnosis present

## 2014-01-02 DIAGNOSIS — I70229 Atherosclerosis of native arteries of extremities with rest pain, unspecified extremity: Secondary | ICD-10-CM | POA: Diagnosis present

## 2014-01-02 DIAGNOSIS — Z7982 Long term (current) use of aspirin: Secondary | ICD-10-CM | POA: Diagnosis not present

## 2014-01-02 DIAGNOSIS — I739 Peripheral vascular disease, unspecified: Secondary | ICD-10-CM | POA: Diagnosis present

## 2014-01-02 DIAGNOSIS — R21 Rash and other nonspecific skin eruption: Secondary | ICD-10-CM | POA: Diagnosis present

## 2014-01-02 DIAGNOSIS — I5022 Chronic systolic (congestive) heart failure: Secondary | ICD-10-CM | POA: Diagnosis present

## 2014-01-02 DIAGNOSIS — I252 Old myocardial infarction: Secondary | ICD-10-CM

## 2014-01-02 DIAGNOSIS — K219 Gastro-esophageal reflux disease without esophagitis: Secondary | ICD-10-CM | POA: Diagnosis present

## 2014-01-02 DIAGNOSIS — I4891 Unspecified atrial fibrillation: Secondary | ICD-10-CM | POA: Diagnosis present

## 2014-01-02 DIAGNOSIS — I70209 Unspecified atherosclerosis of native arteries of extremities, unspecified extremity: Secondary | ICD-10-CM

## 2014-01-02 DIAGNOSIS — I10 Essential (primary) hypertension: Secondary | ICD-10-CM | POA: Diagnosis present

## 2014-01-02 HISTORY — PX: INTRAOPERATIVE ARTERIOGRAM: SHX5157

## 2014-01-02 HISTORY — PX: PATCH ANGIOPLASTY: SHX6230

## 2014-01-02 HISTORY — PX: FEMORAL-POPLITEAL BYPASS GRAFT: SHX937

## 2014-01-02 HISTORY — PX: ENDARTERECTOMY FEMORAL: SHX5804

## 2014-01-02 LAB — GLUCOSE, CAPILLARY
GLUCOSE-CAPILLARY: 144 mg/dL — AB (ref 70–99)
GLUCOSE-CAPILLARY: 172 mg/dL — AB (ref 70–99)
GLUCOSE-CAPILLARY: 149 mg/dL — AB (ref 70–99)
Glucose-Capillary: 115 mg/dL — ABNORMAL HIGH (ref 70–99)

## 2014-01-02 SURGERY — BYPASS GRAFT FEMORAL-POPLITEAL ARTERY
Anesthesia: General | Site: Leg Upper | Laterality: Left

## 2014-01-02 MED ORDER — ADULT MULTIVITAMIN W/MINERALS CH
1.0000 | ORAL_TABLET | Freq: Every day | ORAL | Status: DC
  Administered 2014-01-03 – 2014-01-04 (×2): 1 via ORAL
  Filled 2014-01-02 (×3): qty 1

## 2014-01-02 MED ORDER — HEPARIN SODIUM (PORCINE) 1000 UNIT/ML IJ SOLN
INTRAMUSCULAR | Status: DC | PRN
  Administered 2014-01-02 (×2): 8000 [IU] via INTRAVENOUS

## 2014-01-02 MED ORDER — POTASSIUM CHLORIDE CRYS ER 10 MEQ PO TBCR
10.0000 meq | EXTENDED_RELEASE_TABLET | Freq: Every day | ORAL | Status: DC
  Administered 2014-01-02 – 2014-01-04 (×3): 10 meq via ORAL
  Filled 2014-01-02 (×4): qty 1

## 2014-01-02 MED ORDER — ALPRAZOLAM 0.5 MG PO TABS
1.0000 mg | ORAL_TABLET | Freq: Two times a day (BID) | ORAL | Status: DC | PRN
  Administered 2014-01-02: 1 mg via ORAL
  Filled 2014-01-02: qty 2

## 2014-01-02 MED ORDER — ACETAMINOPHEN 325 MG PO TABS: 325.0000 mg | ORAL_TABLET | ORAL | Status: DC | PRN

## 2014-01-02 MED ORDER — HYDROCODONE-ACETAMINOPHEN 5-325 MG PO TABS
1.0000 | ORAL_TABLET | Freq: Three times a day (TID) | ORAL | Status: DC | PRN
  Administered 2014-01-02: 1 via ORAL
  Filled 2014-01-02: qty 1

## 2014-01-02 MED ORDER — OXYCODONE HCL 5 MG PO TABS: 5.0000 mg | ORAL_TABLET | Freq: Once | ORAL | Status: DC | PRN

## 2014-01-02 MED ORDER — FENTANYL CITRATE 0.05 MG/ML IJ SOLN
INTRAMUSCULAR | Status: DC | PRN
  Administered 2014-01-02 (×5): 50 ug via INTRAVENOUS
  Administered 2014-01-02: 100 ug via INTRAVENOUS

## 2014-01-02 MED ORDER — THROMBIN 20000 UNITS EX SOLR
CUTANEOUS | Status: AC
  Filled 2014-01-02: qty 20000

## 2014-01-02 MED ORDER — DOCUSATE SODIUM 100 MG PO CAPS
100.0000 mg | ORAL_CAPSULE | Freq: Every day | ORAL | Status: DC
  Administered 2014-01-03 – 2014-01-04 (×2): 100 mg via ORAL
  Filled 2014-01-02 (×3): qty 1

## 2014-01-02 MED ORDER — FLUOCINONIDE 0.05 % EX SOLN
1.0000 "application " | Freq: Three times a day (TID) | CUTANEOUS | Status: DC
  Administered 2014-01-03 – 2014-01-04 (×3): 1 via TOPICAL
  Filled 2014-01-02: qty 60

## 2014-01-02 MED ORDER — APIXABAN 5 MG PO TABS
5.0000 mg | ORAL_TABLET | Freq: Two times a day (BID) | ORAL | Status: DC
  Administered 2014-01-03 – 2014-01-04 (×4): 5 mg via ORAL
  Filled 2014-01-02 (×6): qty 1

## 2014-01-02 MED ORDER — HYDRALAZINE HCL 20 MG/ML IJ SOLN: 5.0000 mg | INTRAMUSCULAR | Status: DC | PRN

## 2014-01-02 MED ORDER — ONDANSETRON HCL 4 MG/2ML IJ SOLN: 4.0000 mg | Freq: Four times a day (QID) | INTRAMUSCULAR | Status: DC | PRN

## 2014-01-02 MED ORDER — FLUTICASONE PROPIONATE 50 MCG/ACT NA SUSP
2.0000 | Freq: Two times a day (BID) | NASAL | Status: DC
  Administered 2014-01-02 – 2014-01-04 (×5): 2 via NASAL
  Filled 2014-01-02: qty 16

## 2014-01-02 MED ORDER — DOPAMINE-DEXTROSE 3.2-5 MG/ML-% IV SOLN
3.0000 ug/kg/min | INTRAVENOUS | Status: DC | PRN
  Filled 2014-01-02: qty 250

## 2014-01-02 MED ORDER — LISINOPRIL 10 MG PO TABS
10.0000 mg | ORAL_TABLET | Freq: Every day | ORAL | Status: DC
  Administered 2014-01-03 – 2014-01-04 (×2): 10 mg via ORAL
  Filled 2014-01-02 (×3): qty 1

## 2014-01-02 MED ORDER — VECURONIUM BROMIDE 10 MG IV SOLR
INTRAVENOUS | Status: DC | PRN
  Administered 2014-01-02 (×2): 1 mg via INTRAVENOUS
  Administered 2014-01-02: 2 mg via INTRAVENOUS
  Administered 2014-01-02: 1 mg via INTRAVENOUS
  Administered 2014-01-02: 2 mg via INTRAVENOUS
  Administered 2014-01-02: 1 mg via INTRAVENOUS
  Administered 2014-01-02: 2 mg via INTRAVENOUS

## 2014-01-02 MED ORDER — ROCURONIUM BROMIDE 50 MG/5ML IV SOLN
INTRAVENOUS | Status: AC
  Filled 2014-01-02: qty 1

## 2014-01-02 MED ORDER — FLUOCINONIDE 0.05 % EX GEL
Freq: Three times a day (TID) | CUTANEOUS | Status: DC
  Administered 2014-01-02: 21:00:00 via TOPICAL
  Administered 2014-01-03 (×2): 1 via TOPICAL
  Administered 2014-01-03 – 2014-01-04 (×3): via TOPICAL
  Administered 2014-01-04: 1 via TOPICAL
  Administered 2014-01-04: 10:00:00 via TOPICAL
  Filled 2014-01-02: qty 15

## 2014-01-02 MED ORDER — ACETAMINOPHEN 650 MG RE SUPP: 325.0000 mg | RECTAL | Status: DC | PRN

## 2014-01-02 MED ORDER — GLYCOPYRROLATE 0.2 MG/ML IJ SOLN
INTRAMUSCULAR | Status: DC | PRN
  Administered 2014-01-02: 0.6 mg via INTRAVENOUS

## 2014-01-02 MED ORDER — ALUM & MAG HYDROXIDE-SIMETH 200-200-20 MG/5ML PO SUSP: 15.0000 mL | ORAL | Status: DC | PRN

## 2014-01-02 MED ORDER — METOPROLOL TARTRATE 1 MG/ML IV SOLN: 2.0000 mg | INTRAVENOUS | Status: DC | PRN

## 2014-01-02 MED ORDER — CITALOPRAM HYDROBROMIDE 20 MG PO TABS
20.0000 mg | ORAL_TABLET | Freq: Every day | ORAL | Status: DC
  Administered 2014-01-02 – 2014-01-04 (×3): 20 mg via ORAL
  Filled 2014-01-02 (×4): qty 1

## 2014-01-02 MED ORDER — LIDOCAINE HCL (CARDIAC) 20 MG/ML IV SOLN
INTRAVENOUS | Status: DC | PRN
  Administered 2014-01-02: 80 mg via INTRAVENOUS

## 2014-01-02 MED ORDER — PHENOL 1.4 % MT LIQD: 1.0000 | OROMUCOSAL | Status: DC | PRN

## 2014-01-02 MED ORDER — ARTIFICIAL TEARS OP OINT
TOPICAL_OINTMENT | OPHTHALMIC | Status: AC
  Filled 2014-01-02: qty 3.5

## 2014-01-02 MED ORDER — FELODIPINE ER 5 MG PO TB24
5.0000 mg | ORAL_TABLET | Freq: Every day | ORAL | Status: DC
  Administered 2014-01-02 – 2014-01-04 (×3): 5 mg via ORAL
  Filled 2014-01-02 (×4): qty 1

## 2014-01-02 MED ORDER — GLYCOPYRROLATE 0.2 MG/ML IJ SOLN
INTRAMUSCULAR | Status: AC
  Filled 2014-01-02: qty 3

## 2014-01-02 MED ORDER — ARTIFICIAL TEARS OP OINT
TOPICAL_OINTMENT | OPHTHALMIC | Status: DC | PRN
  Administered 2014-01-02: 1 via OPHTHALMIC

## 2014-01-02 MED ORDER — NITROGLYCERIN 0.4 MG SL SUBL: 0.4000 mg | SUBLINGUAL_TABLET | SUBLINGUAL | Status: DC | PRN

## 2014-01-02 MED ORDER — HEPARIN SODIUM (PORCINE) 1000 UNIT/ML IJ SOLN
INTRAMUSCULAR | Status: AC
  Filled 2014-01-02: qty 1

## 2014-01-02 MED ORDER — ALBUMIN HUMAN 5 % IV SOLN
INTRAVENOUS | Status: DC | PRN
  Administered 2014-01-02: 11:00:00 via INTRAVENOUS

## 2014-01-02 MED ORDER — INSULIN ASPART 100 UNIT/ML ~~LOC~~ SOLN
0.0000 [IU] | Freq: Three times a day (TID) | SUBCUTANEOUS | Status: DC
  Administered 2014-01-03 – 2014-01-05 (×5): 1 [IU] via SUBCUTANEOUS

## 2014-01-02 MED ORDER — VITAMIN D3 25 MCG (1000 UNIT) PO TABS
1000.0000 [IU] | ORAL_TABLET | Freq: Two times a day (BID) | ORAL | Status: DC
  Administered 2014-01-02 – 2014-01-04 (×5): 1000 [IU] via ORAL
  Filled 2014-01-02 (×7): qty 1

## 2014-01-02 MED ORDER — EPHEDRINE SULFATE 50 MG/ML IJ SOLN
INTRAMUSCULAR | Status: DC | PRN
  Administered 2014-01-02: 5 mg via INTRAVENOUS
  Administered 2014-01-02 (×2): 10 mg via INTRAVENOUS
  Administered 2014-01-02: 5 mg via INTRAVENOUS

## 2014-01-02 MED ORDER — METOPROLOL SUCCINATE ER 100 MG PO TB24
100.0000 mg | ORAL_TABLET | Freq: Every day | ORAL | Status: DC
  Administered 2014-01-03 – 2014-01-04 (×2): 100 mg via ORAL
  Filled 2014-01-02 (×3): qty 1

## 2014-01-02 MED ORDER — DEXTROSE 5 % IV SOLN
1.5000 g | INTRAVENOUS | Status: DC
  Filled 2014-01-02: qty 1.5

## 2014-01-02 MED ORDER — IOHEXOL 300 MG/ML  SOLN
INTRAMUSCULAR | Status: DC | PRN
  Administered 2014-01-02: 25 mL via INTRA_ARTERIAL

## 2014-01-02 MED ORDER — HYDROCORTISONE 2.5 % EX LOTN: 1.0000 "application " | TOPICAL_LOTION | Freq: Three times a day (TID) | CUTANEOUS | Status: DC

## 2014-01-02 MED ORDER — GLYBURIDE 1.25 MG PO TABS
1.2500 mg | ORAL_TABLET | Freq: Two times a day (BID) | ORAL | Status: DC
  Administered 2014-01-02 – 2014-01-04 (×5): 1.25 mg via ORAL
  Filled 2014-01-02 (×9): qty 1

## 2014-01-02 MED ORDER — PHENYLEPHRINE HCL 10 MG/ML IJ SOLN
10.0000 mg | INTRAVENOUS | Status: DC | PRN
  Administered 2014-01-02: 50 ug/min via INTRAVENOUS

## 2014-01-02 MED ORDER — VITAMIN E 180 MG (400 UNIT) PO CAPS
400.0000 [IU] | ORAL_CAPSULE | Freq: Every day | ORAL | Status: DC
  Administered 2014-01-03 – 2014-01-04 (×2): 400 [IU] via ORAL
  Filled 2014-01-02 (×3): qty 1

## 2014-01-02 MED ORDER — NEOSTIGMINE METHYLSULFATE 10 MG/10ML IV SOLN
INTRAVENOUS | Status: DC | PRN
Start: 2014-01-02 — End: 2014-01-02
  Administered 2014-01-02: 4 mg via INTRAVENOUS

## 2014-01-02 MED ORDER — ALBUTEROL SULFATE (2.5 MG/3ML) 0.083% IN NEBU
2.5000 mg | INHALATION_SOLUTION | Freq: Two times a day (BID) | RESPIRATORY_TRACT | Status: DC
Start: 2014-01-02 — End: 2014-01-03
  Administered 2014-01-02 – 2014-01-03 (×2): 2.5 mg via RESPIRATORY_TRACT
  Filled 2014-01-02 (×3): qty 3

## 2014-01-02 MED ORDER — 0.9 % SODIUM CHLORIDE (POUR BTL) OPTIME
TOPICAL | Status: DC | PRN
  Administered 2014-01-02 (×2): 1000 mL

## 2014-01-02 MED ORDER — BUDESONIDE-FORMOTEROL FUMARATE 160-4.5 MCG/ACT IN AERO
1.0000 | INHALATION_SPRAY | Freq: Two times a day (BID) | RESPIRATORY_TRACT | Status: DC
  Administered 2014-01-02 – 2014-01-04 (×4): 1 via RESPIRATORY_TRACT
  Filled 2014-01-02: qty 6

## 2014-01-02 MED ORDER — SODIUM CHLORIDE 0.9 % IV SOLN: 500.0000 mL | Freq: Once | INTRAVENOUS | Status: AC | PRN

## 2014-01-02 MED ORDER — LACTATED RINGERS IV SOLN
INTRAVENOUS | Status: DC | PRN
  Administered 2014-01-02 (×2): via INTRAVENOUS

## 2014-01-02 MED ORDER — PROPOFOL 10 MG/ML IV BOLUS
INTRAVENOUS | Status: AC
  Filled 2014-01-02: qty 20

## 2014-01-02 MED ORDER — NEOSTIGMINE METHYLSULFATE 10 MG/10ML IV SOLN
INTRAVENOUS | Status: AC
  Filled 2014-01-02: qty 1

## 2014-01-02 MED ORDER — LIDOCAINE HCL (CARDIAC) 20 MG/ML IV SOLN
INTRAVENOUS | Status: AC
  Filled 2014-01-02: qty 5

## 2014-01-02 MED ORDER — KETOCONAZOLE 2 % EX CREA
1.0000 "application " | TOPICAL_CREAM | Freq: Every day | CUTANEOUS | Status: DC
  Administered 2014-01-02: 19:00:00 via TOPICAL
  Administered 2014-01-03 – 2014-01-04 (×2): 1 via TOPICAL
  Filled 2014-01-02: qty 15

## 2014-01-02 MED ORDER — FENTANYL CITRATE 0.05 MG/ML IJ SOLN
INTRAMUSCULAR | Status: AC
  Filled 2014-01-02: qty 5

## 2014-01-02 MED ORDER — TRAZODONE HCL 100 MG PO TABS
100.0000 mg | ORAL_TABLET | Freq: Every evening | ORAL | Status: DC | PRN
  Filled 2014-01-02: qty 1

## 2014-01-02 MED ORDER — PHENYLEPHRINE HCL 10 MG/ML IJ SOLN
INTRAMUSCULAR | Status: DC | PRN
  Administered 2014-01-02 (×2): 80 ug via INTRAVENOUS

## 2014-01-02 MED ORDER — HYDROMORPHONE HCL 1 MG/ML IJ SOLN
INTRAMUSCULAR | Status: AC
  Filled 2014-01-02: qty 1

## 2014-01-02 MED ORDER — ONDANSETRON HCL 4 MG/2ML IJ SOLN
INTRAMUSCULAR | Status: AC
  Filled 2014-01-02: qty 2

## 2014-01-02 MED ORDER — PANTOPRAZOLE SODIUM 40 MG PO TBEC
40.0000 mg | DELAYED_RELEASE_TABLET | Freq: Every day | ORAL | Status: DC
  Administered 2014-01-02 – 2014-01-04 (×3): 40 mg via ORAL
  Filled 2014-01-02 (×3): qty 1

## 2014-01-02 MED ORDER — KETOCONAZOLE 2 % EX SHAM
1.0000 "application " | MEDICATED_SHAMPOO | CUTANEOUS | Status: DC
  Filled 2014-01-02 (×2): qty 120

## 2014-01-02 MED ORDER — SODIUM CHLORIDE 0.9 % IV SOLN
INTRAVENOUS | Status: DC
  Administered 2014-01-02: 17:00:00 via INTRAVENOUS

## 2014-01-02 MED ORDER — FOLIC ACID 1 MG PO TABS
1.0000 mg | ORAL_TABLET | Freq: Every day | ORAL | Status: DC
  Administered 2014-01-02 – 2014-01-04 (×3): 1 mg via ORAL
  Filled 2014-01-02 (×4): qty 1

## 2014-01-02 MED ORDER — ROCURONIUM BROMIDE 100 MG/10ML IV SOLN
INTRAVENOUS | Status: DC | PRN
  Administered 2014-01-02: 50 mg via INTRAVENOUS

## 2014-01-02 MED ORDER — BISACODYL 10 MG RE SUPP: 10.0000 mg | Freq: Every day | RECTAL | Status: DC | PRN

## 2014-01-02 MED ORDER — POTASSIUM CHLORIDE CRYS ER 20 MEQ PO TBCR: 20.0000 meq | EXTENDED_RELEASE_TABLET | Freq: Every day | ORAL | Status: DC | PRN

## 2014-01-02 MED ORDER — HYDROXYZINE HCL 25 MG PO TABS
25.0000 mg | ORAL_TABLET | Freq: Three times a day (TID) | ORAL | Status: DC | PRN
  Filled 2014-01-02: qty 1

## 2014-01-02 MED ORDER — ONE-DAILY MULTI VITAMINS PO TABS: 1.0000 | ORAL_TABLET | Freq: Every day | ORAL | Status: DC

## 2014-01-02 MED ORDER — OXYCODONE HCL 5 MG PO TABS
5.0000 mg | ORAL_TABLET | ORAL | Status: DC | PRN
  Administered 2014-01-03: 5 mg via ORAL
  Filled 2014-01-02: qty 1

## 2014-01-02 MED ORDER — ROSUVASTATIN CALCIUM 40 MG PO TABS
40.0000 mg | ORAL_TABLET | Freq: Every day | ORAL | Status: DC
  Administered 2014-01-03 – 2014-01-04 (×2): 40 mg via ORAL
  Filled 2014-01-02 (×3): qty 1

## 2014-01-02 MED ORDER — AMLODIPINE BESYLATE 5 MG PO TABS
5.0000 mg | ORAL_TABLET | Freq: Every day | ORAL | Status: DC
  Administered 2014-01-03 – 2014-01-04 (×2): 5 mg via ORAL
  Filled 2014-01-02 (×3): qty 1

## 2014-01-02 MED ORDER — LABETALOL HCL 5 MG/ML IV SOLN
10.0000 mg | INTRAVENOUS | Status: DC | PRN
  Filled 2014-01-02: qty 4

## 2014-01-02 MED ORDER — SODIUM CHLORIDE 0.9 % IR SOLN
Status: DC | PRN
  Administered 2014-01-02: 500 mL

## 2014-01-02 MED ORDER — MORPHINE SULFATE 2 MG/ML IJ SOLN
2.0000 mg | INTRAMUSCULAR | Status: DC | PRN
  Administered 2014-01-02 – 2014-01-03 (×4): 2 mg via INTRAVENOUS
  Administered 2014-01-03: 4 mg via INTRAVENOUS
  Filled 2014-01-02 (×3): qty 1
  Filled 2014-01-02: qty 2
  Filled 2014-01-02: qty 1

## 2014-01-02 MED ORDER — MIDAZOLAM HCL 2 MG/2ML IJ SOLN
INTRAMUSCULAR | Status: AC
  Filled 2014-01-02: qty 2

## 2014-01-02 MED ORDER — MIDAZOLAM HCL 5 MG/5ML IJ SOLN
INTRAMUSCULAR | Status: DC | PRN
  Administered 2014-01-02: 1 mg via INTRAVENOUS

## 2014-01-02 MED ORDER — OXYCODONE HCL 5 MG/5ML PO SOLN: 5.0000 mg | Freq: Once | ORAL | Status: DC | PRN

## 2014-01-02 MED ORDER — PROPOFOL 10 MG/ML IV BOLUS
INTRAVENOUS | Status: DC | PRN
  Administered 2014-01-02: 80 mg via INTRAVENOUS
  Administered 2014-01-02 (×2): 20 mg via INTRAVENOUS

## 2014-01-02 MED ORDER — ONDANSETRON HCL 4 MG/2ML IJ SOLN
INTRAMUSCULAR | Status: DC | PRN
  Administered 2014-01-02: 4 mg via INTRAVENOUS

## 2014-01-02 MED ORDER — DEXTROSE 5 % IV SOLN
1.5000 g | Freq: Two times a day (BID) | INTRAVENOUS | Status: AC
  Administered 2014-01-02 – 2014-01-03 (×2): 1.5 g via INTRAVENOUS
  Filled 2014-01-02 (×3): qty 1.5

## 2014-01-02 MED ORDER — NITROGLYCERIN 0.4 MG/HR TD PT24
0.4000 mg | MEDICATED_PATCH | TRANSDERMAL | Status: DC
  Administered 2014-01-03 – 2014-01-04 (×2): 0.4 mg via TRANSDERMAL
  Filled 2014-01-02 (×3): qty 1

## 2014-01-02 MED ORDER — ASPIRIN EC 81 MG PO TBEC
81.0000 mg | DELAYED_RELEASE_TABLET | Freq: Every day | ORAL | Status: DC
  Administered 2014-01-03 – 2014-01-04 (×2): 81 mg via ORAL
  Filled 2014-01-02 (×3): qty 1

## 2014-01-02 MED ORDER — HYDROMORPHONE HCL 1 MG/ML IJ SOLN
0.2500 mg | INTRAMUSCULAR | Status: DC | PRN
  Administered 2014-01-02 (×4): 0.5 mg via INTRAVENOUS

## 2014-01-02 MED ORDER — FLUTICASONE PROPIONATE HFA 110 MCG/ACT IN AERO
2.0000 | INHALATION_SPRAY | Freq: Two times a day (BID) | RESPIRATORY_TRACT | Status: DC
  Administered 2014-01-02 – 2014-01-04 (×4): 2 via RESPIRATORY_TRACT
  Filled 2014-01-02: qty 12

## 2014-01-02 MED ORDER — GUAIFENESIN-DM 100-10 MG/5ML PO SYRP: 15.0000 mL | ORAL_SOLUTION | ORAL | Status: DC | PRN

## 2014-01-02 MED ORDER — ALBUTEROL SULFATE (2.5 MG/3ML) 0.083% IN NEBU
2.5000 mg | INHALATION_SOLUTION | Freq: Four times a day (QID) | RESPIRATORY_TRACT | Status: DC | PRN
  Administered 2014-01-04: 2.5 mg via RESPIRATORY_TRACT
  Filled 2014-01-02: qty 3

## 2014-01-02 SURGICAL SUPPLY — 61 items
BANDAGE ESMARK 6X9 LF (GAUZE/BANDAGES/DRESSINGS) IMPLANT
BNDG ESMARK 6X9 LF (GAUZE/BANDAGES/DRESSINGS)
CANISTER SUCTION 2500CC (MISCELLANEOUS) ×4 IMPLANT
CANNULA VESSEL W/WING WO/VALVE (CANNULA) ×8 IMPLANT
CLIP TI MEDIUM 24 (CLIP) ×4 IMPLANT
CLIP TI WIDE RED SMALL 24 (CLIP) ×4 IMPLANT
CUFF TOURNIQUET SINGLE 24IN (TOURNIQUET CUFF) IMPLANT
CUFF TOURNIQUET SINGLE 34IN LL (TOURNIQUET CUFF) IMPLANT
CUFF TOURNIQUET SINGLE 44IN (TOURNIQUET CUFF) IMPLANT
DERMABOND ADVANCED (GAUZE/BANDAGES/DRESSINGS) ×1
DERMABOND ADVANCED .7 DNX12 (GAUZE/BANDAGES/DRESSINGS) ×3 IMPLANT
DRAIN SNY WOU (WOUND CARE) IMPLANT
DRAPE X-RAY CASS 24X20 (DRAPES) ×4 IMPLANT
DRSG COVADERM 4X14 (GAUZE/BANDAGES/DRESSINGS) ×8 IMPLANT
ELECT REM PT RETURN 9FT ADLT (ELECTROSURGICAL) ×4
ELECTRODE REM PT RTRN 9FT ADLT (ELECTROSURGICAL) ×3 IMPLANT
EVACUATOR SILICONE 100CC (DRAIN) IMPLANT
GAUZE SPONGE 4X4 16PLY XRAY LF (GAUZE/BANDAGES/DRESSINGS) ×4 IMPLANT
GLOVE BIO SURGEON STRL SZ 6.5 (GLOVE) ×4 IMPLANT
GLOVE BIO SURGEON STRL SZ7.5 (GLOVE) ×4 IMPLANT
GLOVE BIOGEL PI IND STRL 6.5 (GLOVE) ×15 IMPLANT
GLOVE BIOGEL PI IND STRL 7.0 (GLOVE) ×3 IMPLANT
GLOVE BIOGEL PI IND STRL 7.5 (GLOVE) ×3 IMPLANT
GLOVE BIOGEL PI INDICATOR 6.5 (GLOVE) ×5
GLOVE BIOGEL PI INDICATOR 7.0 (GLOVE) ×1
GLOVE BIOGEL PI INDICATOR 7.5 (GLOVE) ×1
GLOVE ECLIPSE 6.5 STRL STRAW (GLOVE) ×8 IMPLANT
GLOVE SS BIOGEL STRL SZ 7 (GLOVE) ×3 IMPLANT
GLOVE SUPERSENSE BIOGEL SZ 7 (GLOVE) ×1
GOWN STRL REUS W/ TWL LRG LVL3 (GOWN DISPOSABLE) ×12 IMPLANT
GOWN STRL REUS W/ TWL XL LVL3 (GOWN DISPOSABLE) ×3 IMPLANT
GOWN STRL REUS W/TWL LRG LVL3 (GOWN DISPOSABLE) ×4
GOWN STRL REUS W/TWL XL LVL3 (GOWN DISPOSABLE) ×1
KIT BASIN OR (CUSTOM PROCEDURE TRAY) ×4 IMPLANT
KIT ROOM TURNOVER OR (KITS) ×4 IMPLANT
NS IRRIG 1000ML POUR BTL (IV SOLUTION) ×8 IMPLANT
PACK PERIPHERAL VASCULAR (CUSTOM PROCEDURE TRAY) ×4 IMPLANT
PAD ARMBOARD 7.5X6 YLW CONV (MISCELLANEOUS) ×8 IMPLANT
PADDING CAST COTTON 6X4 STRL (CAST SUPPLIES) IMPLANT
PROBE PENCIL 8 MHZ STRL DISP (MISCELLANEOUS) ×4 IMPLANT
SET COLLECT BLD 21X3/4 12 (NEEDLE) ×4 IMPLANT
SPONGE SURGIFOAM ABS GEL 100 (HEMOSTASIS) IMPLANT
STAPLER VISISTAT 35W (STAPLE) ×8 IMPLANT
STOPCOCK 4 WAY LG BORE MALE ST (IV SETS) ×4 IMPLANT
SUT PROLENE 5 0 C 1 24 (SUTURE) ×12 IMPLANT
SUT PROLENE 6 0 CC (SUTURE) ×16 IMPLANT
SUT PROLENE 7 0 BV 1 (SUTURE) ×8 IMPLANT
SUT PROLENE 7 0 BV1 MDA (SUTURE) IMPLANT
SUT SILK 2 0 SH (SUTURE) ×4 IMPLANT
SUT SILK 3 0 (SUTURE) ×4
SUT SILK 3-0 18XBRD TIE 12 (SUTURE) ×12 IMPLANT
SUT VIC AB 2-0 CTX 36 (SUTURE) ×8 IMPLANT
SUT VIC AB 3-0 SH 27 (SUTURE) ×5
SUT VIC AB 3-0 SH 27X BRD (SUTURE) ×15 IMPLANT
SUT VIC AB 4-0 PS2 27 (SUTURE) ×20 IMPLANT
TAPE UMBILICAL COTTON 1/8X30 (MISCELLANEOUS) ×4 IMPLANT
TOWEL OR 17X24 6PK STRL BLUE (TOWEL DISPOSABLE) ×8 IMPLANT
TRAY FOLEY CATH 16FRSI W/METER (SET/KITS/TRAYS/PACK) ×4 IMPLANT
TUBING EXTENTION W/L.L. (IV SETS) ×4 IMPLANT
UNDERPAD 30X30 INCONTINENT (UNDERPADS AND DIAPERS) ×4 IMPLANT
WATER STERILE IRR 1000ML POUR (IV SOLUTION) ×4 IMPLANT

## 2014-01-02 NOTE — Transfer of Care (Signed)
Immediate Anesthesia Transfer of Care Note  Patient: Jeffrey Melendez  Procedure(s) Performed: Procedure(s): BYPASS GRAFT FEMORAL- TIBIAL TO PERONEAL BYPASS GRAFT USING NON-REVERSED IPSILATERAL SAPHENOUS VEIN (Left) INTRA OPERATIVE ARTERIOGRAM (Left) PATCH ANGIOPLASTY - Left profundaplasty (Left) ENDARTERECTOMY LEFT COMMON FEMORAL ARTERY  (Left)  Patient Location: PACU  Anesthesia Type:General  Level of Consciousness: awake, alert , oriented and patient cooperative  Airway & Oxygen Therapy: Patient Spontanous Breathing and Patient connected to nasal cannula oxygen  Post-op Assessment: Report given to PACU RN, Post -op Vital signs reviewed and stable and Patient moving all extremities X 4  Post vital signs: Reviewed and stable  Complications: No apparent anesthesia complications

## 2014-01-02 NOTE — Anesthesia Preprocedure Evaluation (Signed)
Anesthesia Evaluation  Patient identified by MRN, date of birth, ID band Patient awake    Reviewed: Allergy & Precautions, H&P , NPO status , Patient's Chart, lab work & pertinent test results  Airway Mallampati: II   Neck ROM: full    Dental   Pulmonary shortness of breath, asthma , pneumonia -, COPDCurrent Smoker,          Cardiovascular hypertension, + CAD, + Past MI, + Cardiac Stents, + Peripheral Vascular Disease and +CHF + dysrhythmias Atrial Fibrillation     Neuro/Psych  Headaches, Anxiety Depression TIA   GI/Hepatic GERD-  ,  Endo/Other  diabetes, Type 2  Renal/GU      Musculoskeletal  (+) Arthritis -,   Abdominal   Peds  Hematology   Anesthesia Other Findings   Reproductive/Obstetrics                             Anesthesia Physical Anesthesia Plan  ASA: III  Anesthesia Plan: General   Post-op Pain Management:    Induction: Intravenous  Airway Management Planned: Oral ETT  Additional Equipment: Arterial line  Intra-op Plan:   Post-operative Plan: Extubation in OR  Informed Consent: I have reviewed the patients History and Physical, chart, labs and discussed the procedure including the risks, benefits and alternatives for the proposed anesthesia with the patient or authorized representative who has indicated his/her understanding and acceptance.     Plan Discussed with: CRNA, Anesthesiologist and Surgeon  Anesthesia Plan Comments:         Anesthesia Quick Evaluation

## 2014-01-02 NOTE — Op Note (Signed)
Procedure: Left common femoral endarterectomy with profundaplasty, left femoral to tibioperoneal trunk bypass with non-reversed ipsilateral great saphenous vein, intraop angiogram  Preoperative diagnosis: Rest pain left foot  Postoperative diagnosis: Same  Anesthesia: General  Asst.: Lianne Cure, PA-C, Karsten Ro PA-c  Operative findings:     Calcified vessels, good quality saphenous vein 3 mm  Operative details: After obtaining informed consent, the patient was taken to the operating room. The patient was placed in supine position on the operating room table. After induction of general anesthesia and endotracheal intubation, a Foley catheter was placed. Next, the patient's entire left lower extremity was prepped and draped in the usual sterile fashion. A longitudinal incision was then made in the left groin and carried down through the subcutaneous tissues to expose the left common femoral artery.   The common femoral artery was dissected free circumferentially.  It was severely calcified especially at the femoral bifurcation but there was a pulse within the common femoral artery. The distal external iliac artery was dissected free circumferentially underneath the inguinal ligament.  A vessel loop was also placed around the distal external iliac artery the SFA and the profunda as well as the circumflex iliac branches   Next the saphenofemoral junction was identified in the medial portion of the groin incision and this was harvested through several skip incisions on the medial aspect of the leg.  Side branches were ligated and divided between silk ties or clips.  The vein was of good quality 3 mm diameter  The vein harvest incision was deepened into the fascia at the below knee segment and the below knee popliteal space was entered.  The popliteal artery was dissected free circumferentially.  It was calcified but was clampable.  Dissection was carried down to the tibioperoneal trunk and this was  dissected free circumferentially as well the proximal anterior tibial artery peroneal and posterior tibial arteries.  A tunnel was then created between the heads of the gastrocnemius muscle subsartorial up to the groin.  The vein was ligated distally and at the saphenofemoral junction with a 5 0 prolene.  The vein was gently distended with heparinized saline and inspected for hemostasis.    The patient was given 8000 units of heparin.  After appropriate circulation time, the distal right external iliac artery was controlled with a small Cooley clamp. The profunda and SFA were controlled with vessel loops.   A longitudinal opening was made in the common femoral artery on its anterior surface.  The vessel was calcified and there was posterior medial plaque nearly occluding the artery as well at the profunda origin.  I proceed to do a common femoral endarterectomy and profundaplasty down to the first profunda branches.  A good proximal and distal endpoint was obtained.  A piece of vein was reversed and opened longitundinally and sewn on as a patch angioplasty using a running 6 0 prolene suture. The remaining vein was then placed in a non reversed configuration.  The arteriotomy was extended with Pott's scissors thru the patch.  The vein was spatulated and sewn end to side to the artery using a running 6 0 Prolene.  Just prior to completion anastomosis everything was forebled backbled and thoroughly flushed. Proximal clamp and distal clamps were removed and there was good pulsatile flow in the profunda femoris artery immediately. The SFA was chronically occluded.    The graft was then brought through the subsartorial tunnel down to the below-knee popliteal artery after marking for orientation. The below-knee popliteal  artery was controlled proximally with a Henley clamp.  The tibial vessels were controlled with vessel loops.   A longitudinal opening was made in the distal tibioperoneal trunk in an area that was  fairly free of calcification. The graft was then cut to length and spatulated and sewn end of graft to side of artery using running 6-0 Prolene suture.  At completion of the anastomosis everything was forebled backbled and thoroughly flushed. The remainder of the anastomosis was completed and all clamps were removed restoring flow to the tibial vessels. An intraoperative arteriogram was  obtained by placing a 21 gauge butterfly needle in the proximal vein graft.  With inflow occlusion contrast was injected.  The distal anastomosis was patent with 3 vessel runoff but diseased small tibial vessels.  The patient had monophasic to biphasic Doppler flow in the posterior tibial and dorsalis pedis areas of the foot.  One repair stitch was placed in the lateral wall of the proximal anastomosis and at the medial heel of the distal anastomosis.  The patient had been given an additional 8000 units of heparin during the case.    After hemostasis was obtained, the deep layers and subcutaneous layers of the below-knee popliteal incision were closed with running 3-0 Vicryl suture. The skin was closed with staples.   The saphenectomy incisions were closed with running 3 0 vicryl follow by staples.  The groin was inspected and found to be hemostatic. This was then closed in multiple layers of running 2 0 and 3-0 Vicryl suture and 4-0 subcuticular stitch. The patient tolerated the procedure well and there were no complications. Instrument sponge and needle counts correct in the case. Patient was taken to the recovery in stable condition.  Fabienne Brunsharles Fields, MD [facility] [place] Office: [phone] Pager: [phone]

## 2014-01-02 NOTE — Interval H&P Note (Signed)
History and Physical Interval Note:  01/02/2014 7:19 AM  Jeffrey Melendez  has presented today for surgery, with the diagnosis of Left foot rest pain with bilateral lower extremity claudication I70.222; I70.213  The various methods of treatment have been discussed with the patient and family. After consideration of risks, benefits and other options for treatment, the patient has consented to  Procedure(s): BYPASS GRAFT FEMORAL-POPLITEAL ARTERY (Left) as a surgical intervention .  The patient's history has been reviewed, patient examined, no change in status, stable for surgery.  I have reviewed the patient's chart and labs.  Questions were answered to the patient's satisfaction.     [name]

## 2014-01-02 NOTE — Progress Notes (Signed)
  [facility] Progress Note  01/02/2014 5:49 PM Day of Surgery  Subjective:  Having pain with incisions. Also complaining of itchy rash on chest and back that has been present for "years." Has been using 2% hydrocortisone without improvement.   Tmax   Filed Vitals:   01/02/14 1630  BP: 124/78  Pulse: 77  Temp: 97.2 F (36.2 C)  Resp: 14    Physical Exam: Incisions:  Left leg incisions with minimal bloody drainage on dressings.  Extremities:  Left DP and PT doppler signals. Dermatologic: erythematous scaly red patches with diffuse annular lesions with red scaly borders consistent with tinea corporis  CBC    Component Value Date/Time   WBC 7.4 12/29/2013 1358   RBC 4.00* 12/29/2013 1358   HGB 13.9 12/29/2013 1358   HCT 38.4* 12/29/2013 1358   PLT 200 12/29/2013 1358   MCV 96.0 12/29/2013 1358   MCH 34.8* 12/29/2013 1358   MCHC 36.2* 12/29/2013 1358   RDW 12.7 12/29/2013 1358   LYMPHSABS 2.6 10/08/2013 2030   MONOABS 0.6 10/08/2013 2030   EOSABS 0.8* 10/08/2013 2030   BASOSABS 0.1 10/08/2013 2030    BMET    Component Value Date/Time   NA 138 12/29/2013 1358   K 4.9 12/29/2013 1358   CL 101 12/29/2013 1358   CO2 24 12/29/2013 1358   GLUCOSE 66* 12/29/2013 1358   BUN 11 12/29/2013 1358   CREATININE 0.86 12/29/2013 1358   CALCIUM 10.0 12/29/2013 1358   GFRNONAA 88* 12/29/2013 1358   GFRAA >90 12/29/2013 1358    INR    Component Value Date/Time   INR 1.07 12/29/2013 1358     Intake/Output Summary (Last 24 hours) at 01/02/14 1749 Last data filed at 01/02/14 1630  Gross per 24 hour  Intake   2450 ml  Output    870 ml  Net   1580 ml     Assessment:  68 y.o. male is s/p:  Femoral to tibial peroneal trunk bypass using non-reversed ipsilateral vein, left pronfundaplasty and left common femoral endarterectomy   Day of Surgery  Plan: -Continue pain control. -DP and PT doppler signals left foot.  -Mobilize, OOB -Resume eliquis  tomorrow. -Will try topical ketoconazole for rash and have patient follow up with outpatient dermatologist.     Maris Berger, PA-C [facility] Office: [phone] Pager: [phone] 01/02/2014 5:49 PM

## 2014-01-02 NOTE — Anesthesia Postprocedure Evaluation (Signed)
Anesthesia Post Note  Patient: Jeffrey Melendez  Procedure(s) Performed: Procedure(s) (LRB): BYPASS GRAFT FEMORAL- TIBIAL TO PERONEAL BYPASS GRAFT USING NON-REVERSED IPSILATERAL SAPHENOUS VEIN (Left) INTRA OPERATIVE ARTERIOGRAM (Left) PATCH ANGIOPLASTY - Left profundaplasty (Left) ENDARTERECTOMY LEFT COMMON FEMORAL ARTERY  (Left)  Anesthesia type: General  Patient location: PACU  Post pain: Pain level controlled and Adequate analgesia  Post assessment: Post-op Vital signs reviewed, Patient's Cardiovascular Status Stable, Respiratory Function Stable, Patent Airway and Pain level controlled  Last Vitals:  Filed Vitals:   01/02/14 1500  BP: 115/53  Pulse: 72  Temp:   Resp: 15    Post vital signs: Reviewed and stable  Level of consciousness: awake, alert  and oriented  Complications: No apparent anesthesia complications

## 2014-01-02 NOTE — Anesthesia Procedure Notes (Signed)
Procedure Name: Intubation Date/Time: 01/02/2014 7:52 AM Performed by: Adonis Housekeeper Pre-anesthesia Checklist: Patient identified, Emergency Drugs available, Suction available and Patient being monitored Patient Re-evaluated:Patient Re-evaluated prior to inductionOxygen Delivery Method: Circle system utilized Preoxygenation: Pre-oxygenation with 100% oxygen Intubation Type: IV induction Ventilation: Mask ventilation without difficulty and Oral airway inserted - appropriate to patient size Laryngoscope Size: Hyacinth Meeker and 3 (intubation by veronica sutton [employer] student) Grade View: Grade I Tube type: Oral Tube size: 7.0 mm Number of attempts: 1 Placement Confirmation: ETT inserted through vocal cords under direct vision,  positive ETCO2 and breath sounds checked- equal and bilateral Secured at: 23 cm Tube secured with: Tape Dental Injury: Teeth and Oropharynx as per pre-operative assessment

## 2014-01-02 NOTE — H&P (View-Only) (Signed)
[facility] [place] HISTORY AND PHYSICAL    History of Present Illness:  Patient is a 68 y.o. year old male who presents for evaluation of rest pain left foot.  This has been present for a couple of months.  The patient [facility] but got tired of waiting for an appoint.  He also has pain in his left calf and sometimes his right which occurs at 1 block.  He has atrial fibrillation and is on Eliquis.  He is also on aspirin.  he recently underwent a lower extremity arteriogram on September 4. 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 Other medical problems include COPD, CAD, diabetes hyperlipidemia which are all stable.  [facility] which showed an ejection fraction of 27% with a large chronic infarct but no reversible defects. He is currently smoking and greater than 3 minutes spent today on tobacco cessation. He has cut back to one half pack per day. I emphasized to him that durability of any vascular intervention would be limited by his tobacco abuse.    Past Medical History   Diagnosis  Date   .  COPD (chronic obstructive pulmonary disease)     .  Myocardial infarction     .  Stroke     .  PAD (peripheral artery disease)     .  CHF (congestive heart failure)         systolic   .  Hyperlipidemia     .  Anxiety     .  Ischemic cardiomyopathy     .  Coronary artery disease     .  Diabetes mellitus without complication         Past Surgical History   Procedure  Laterality  Date   .  Coronary stent placement           x 5   .  Arm surgery       .  Foot surgery       .  Head surgery       .  Facial fracture surgery       .  Knee surgery       .  Cardiac catheterization    09/2008   .  Skin graft to right arm       .  Fracture surgery         Social History History   Substance Use Topics   .  Smoking status:   Current Every Day Smoker -- 1.00 packs/day for 52 years       Types:  Cigarettes   .  Smokeless tobacco:  Never Used   .  Alcohol Use:  1.8 oz/week       3 Cans of beer per week     Family History No family history on file.  Allergies    Allergies   Allergen  Reactions   .  Other         Med that started with a "G" for leg circulation-"goes against my heart"   .  Wellbutrin [Bupropion Hcl]  Other (See Comments)       Elevated BP   .  Niacin And Related  Rash    Current Outpatient Prescriptions on File Prior to Visit  Medication Sig Dispense Refill  . albuterol (PROVENTIL HFA;VENTOLIN HFA) 108 (90 BASE) MCG/ACT  inhaler Inhale 2 puffs into the lungs 4 (four) times daily as needed for wheezing or shortness of breath.      Marland Kitchen. albuterol (PROVENTIL) (2.5 MG/3ML) 0.083% nebulizer solution Take 2.5 mg by nebulization 4 (four) times daily.      Marland Kitchen. ALPRAZolam (XANAX) 1 MG tablet Take 1 mg by mouth 3 (three) times daily as needed for anxiety.       Marland Kitchen. amLODipine (NORVASC) 5 MG tablet Take 5 mg by mouth daily.      Marland Kitchen. apixaban (ELIQUIS) 5 MG TABS tablet Take 1 tablet (5 mg total) by mouth 2 (two) times daily.  60 tablet  3  . aspirin EC 81 MG tablet Take 81 mg by mouth daily.      . budesonide-formoterol (SYMBICORT) 160-4.5 MCG/ACT inhaler Inhale 1 puff into the lungs 2 (two) times daily.       . cholecalciferol (VITAMIN D) 1000 UNITS tablet Take 1,000 Units by mouth 2 (two) times daily.      . citalopram (CELEXA) 40 MG tablet Take 20 mg by mouth daily.        . felodipine (PLENDIL) 5 MG 24 hr tablet Take 1 tablet (5 mg total) by mouth daily.  30 tablet  4  . fluocinonide (LIDEX) 0.05 % external solution Apply 1 application topically 4 (four) times a week.      . fluticasone (FLONASE) 50 MCG/ACT nasal spray Place 2 sprays into both nostrils 2 (two) times daily.      . fluticasone (FLOVENT HFA) 110 MCG/ACT inhaler Inhale 2 puffs into the lungs 2 (two) times daily.      . folic acid (FOLVITE) 1 MG  tablet Take 1 tablet (1 mg total) by mouth daily.  30 tablet  3  . glyBURIDE (DIABETA) 1.25 MG tablet Take 1.25 mg by mouth 2 (two) times daily.      Marland Kitchen. HYDROcodone-acetaminophen (NORCO/VICODIN) 5-325 MG per tablet Take 1 tablet by mouth 3 (three) times daily as needed for moderate pain.      . hydrocortisone 2.5 % lotion Apply 1 application topically 3 (three) times daily.      . hydrOXYzine (ATARAX/VISTARIL) 25 MG tablet Take 25 mg by mouth 3 (three) times daily as needed for anxiety.       Marland Kitchen. ketoconazole (NIZORAL) 2 % shampoo Apply 1 application topically 3 (three) times a week.      Marland Kitchen. lisinopril (PRINIVIL,ZESTRIL) 10 MG tablet Take 10 mg by mouth daily.        . metoprolol succinate (TOPROL-XL) 100 MG 24 hr tablet Take 100 mg by mouth daily. Take with or immediately following a meal.      . mometasone (ASMANEX) 220 MCG/INH inhaler Inhale 2 puffs into the lungs 2 (two) times daily.        . Multiple Vitamin (MULTIVITAMIN) tablet Take 1 tablet by mouth daily.        . nitroGLYCERIN (NITRODUR - DOSED IN MG/24 HR) 0.4 mg/hr patch Place 0.4 mg onto the skin daily.      . nitroGLYCERIN (NITROSTAT) 0.4 MG SL tablet Place 0.4 mg under the tongue every 5 (five) minutes as needed. For chest pain       . Omega-3 Fatty Acids (FISH OIL) 1000 MG CAPS Take 1,000 mg by mouth 2 (two) times daily.      Marland Kitchen. oxyCODONE (OXY IR/ROXICODONE) 5 MG immediate release tablet Take 1 tablet (5 mg total) by mouth every 4 (four) hours as needed for moderate pain.  30 tablet  0  . pantoprazole (PROTONIX) 40 MG tablet Take 40 mg by mouth daily.      . potassium chloride (K-DUR,KLOR-CON) 10 MEQ tablet Take 10 mEq by mouth daily.      . rosuvastatin (CRESTOR) 40 MG tablet Take 20 mg by mouth daily.       . tadalafil (CIALIS) 5 MG tablet Take 5 mg by mouth daily as needed for erectile dysfunction.       . Thiamine HCl (THIAMINE PO) Take 1 tablet by mouth daily.      . traZODone (DESYREL) 100 MG tablet Take 100 mg by mouth at bedtime  as needed for sleep.       . vitamin E (VITAMIN E) 400 UNIT capsule Take 400 Units by mouth daily.       No current facility-administered medications on file prior to visit.    ROS:    General:  No weight loss, Fever, chills  HEENT: No recent headaches, no nasal bleeding, no visual changes, no sore throat  Neurologic: No dizziness, blackouts, seizures. No recent symptoms of stroke or mini- stroke. No recent episodes of slurred speech, or temporary blindness.  Cardiac: No recent episodes of chest pain/pressure, no shortness of breath at rest.  + shortness of breath with exertion.  Denies history of atrial fibrillation or irregular heartbeat  Vascular: + history of rest pain in feet.  + history of claudication.  No history of non-healing ulcer, No history of DVT    Pulmonary: No home oxygen, no productive cough, no hemoptysis,  No asthma or wheezing  Musculoskeletal:  [ ]  Arthritis, [ ]  Low back pain,  [ ]  Joint pain  Hematologic:No history of hypercoagulable state.  No history of easy bleeding.  No history of anemia  Gastrointestinal: No hematochezia or melena,  No gastroesophageal reflux, no trouble swallowing  Urinary: [ ]  chronic Kidney disease, [ ]  on HD - [ ]  MWF or [ ]  TTHS, [ ]  Burning with urination, [ ]  Frequent urination, [ ]  Difficulty urinating;    Skin: No rashes  Psychological: + history of anxiety,  No history of depression   Physical Examination     Filed Vitals:   12/22/13 0900  BP: 139/79  Pulse: 69  Height: 5' 6.5" (1.689 m)  Weight: 165 lb 3.2 oz (74.934 kg)  SpO2: 100%   General:  Alert and oriented, no acute distress HEENT: Normal Neck: No bruit or JVD Pulmonary: Clear to auscultation bilaterally Cardiac: Regular Rate and Rhythm without murmur Abdomen: Soft, non-tender, non-distended, no mass Skin: No rash, no ulcer Extremity Pulses:  2+ femoral,absent popliteal dorsalis pedis, posterior tibial pulses bilaterally Musculoskeletal: No deformity  or edema     Neurologic: Upper and lower extremity motor 5/5 and symmetric  DATA:   ABI from [facility] dated 8/17 left 0.3, right 0.5.  Vein mapping ultrasound today showed left greater saphenous vein 3-5 mm above the knee 2.5-3 mm below the knee   ASSESSMENT: Rest pain left foot with bilateral claudication. At risk for limb loss.   PLAN:  Stop smoking.  we will stop his Ellik was on 12/29/2013. He is scheduled for left femoral to below-knee popliteal bypass on November 9. Fabienne Bruns, MD [facility] [place] Office: [phone] Pager: [phone]

## 2014-01-03 ENCOUNTER — Encounter (HOSPITAL_COMMUNITY): Payer: Self-pay | Admitting: Vascular Surgery

## 2014-01-03 LAB — GLUCOSE, CAPILLARY
GLUCOSE-CAPILLARY: 133 mg/dL — AB (ref 70–99)
Glucose-Capillary: 140 mg/dL — ABNORMAL HIGH (ref 70–99)
Glucose-Capillary: 114 mg/dL — ABNORMAL HIGH (ref 70–99)
Glucose-Capillary: 139 mg/dL — ABNORMAL HIGH (ref 70–99)

## 2014-01-03 LAB — BASIC METABOLIC PANEL
ANION GAP: 15 (ref 5–15)
BUN: 7 mg/dL (ref 6–23)
CHLORIDE: 93 meq/L — AB (ref 96–112)
CO2: 22 meq/L (ref 19–32)
Calcium: 8.5 mg/dL (ref 8.4–10.5)
Creatinine, Ser: 0.82 mg/dL (ref 0.50–1.35)
GFR calc Af Amer: >90 mL/min (ref >90)
GFR calc non Af Amer: >90 mL/min (ref >90)
Glucose, Bld: 193 mg/dL — ABNORMAL HIGH (ref 70–99)
Potassium: 4.1 mEq/L (ref 3.7–5.3)
Sodium: 130 mEq/L — ABNORMAL LOW (ref 137–147)

## 2014-01-03 LAB — CBC
HEMATOCRIT: 33.5 % — AB (ref 39.0–52.0)
Hemoglobin: 11.5 g/dL — ABNORMAL LOW (ref 13.0–17.0)
MCH: 33.9 pg (ref 26.0–34.0)
MCHC: 34.3 g/dL (ref 30.0–36.0)
MCV: 98.8 fL (ref 78.0–100.0)
PLATELETS: 165 10*3/uL (ref 150–400)
RBC: 3.39 MIL/uL — AB (ref 4.22–5.81)
RDW: 12.6 % (ref 11.5–15.5)
WBC: 7.3 10*3/uL (ref 4.0–10.5)

## 2014-01-03 MED ORDER — SODIUM CHLORIDE 0.9 % IV SOLN: INTRAVENOUS | Status: DC | PRN

## 2014-01-03 MED ORDER — PNEUMOCOCCAL VAC POLYVALENT 25 MCG/0.5ML IJ INJ
0.5000 mL | INJECTION | INTRAMUSCULAR | Status: DC
  Filled 2014-01-03: qty 0.5

## 2014-01-03 MED ORDER — HYDROMORPHONE HCL 1 MG/ML IJ SOLN
0.5000 mg | INTRAMUSCULAR | Status: DC | PRN
Start: 2014-01-03 — End: 2014-01-05

## 2014-01-03 MED ORDER — OXYCODONE-ACETAMINOPHEN 5-325 MG PO TABS
1.0000 | ORAL_TABLET | ORAL | Status: DC | PRN
  Administered 2014-01-03: 1 via ORAL
  Administered 2014-01-03: 2 via ORAL
  Administered 2014-01-03 (×2): 1 via ORAL
  Administered 2014-01-04 (×3): 2 via ORAL
  Administered 2014-01-04: 1 via ORAL
  Administered 2014-01-05: 2 via ORAL
  Filled 2014-01-03 (×2): qty 2
  Filled 2014-01-03: qty 1
  Filled 2014-01-03 (×2): qty 2
  Filled 2014-01-03: qty 1
  Filled 2014-01-03: qty 2
  Filled 2014-01-03: qty 1
  Filled 2014-01-03: qty 2

## 2014-01-03 NOTE — Plan of Care (Signed)
Problem: Phase I Progression Outcomes Goal: Weaning O2 to maintain Sats > 90% Outcome: Completed/Met Date Met:  01/03/14 Goal: Pain controlled with appropriate interventions Outcome: Progressing Goal: Hemodynamically stable Outcome: Completed/Met Date Met:  01/03/14     

## 2014-01-03 NOTE — Plan of Care (Signed)
Problem: Phase I Progression Outcomes Goal: Pain controlled with appropriate interventions Outcome: Completed/Met Date Met:  01/03/14

## 2014-01-03 NOTE — Progress Notes (Signed)
Report called to [facility], RN, and patient was transferred to 2W. Vital signs stable, and pt in no apparent distress.

## 2014-01-03 NOTE — Progress Notes (Addendum)
   [facility] [place]  Subjective  -" The pain in my leg is not controlled on this medication."   Objective 109/62 75 98.1 F (36.7 C) (Oral) 13 95%  Intake/Output Summary (Last 24 hours) at 01/03/14 1610 Last data filed at 01/03/14 0600  Gross per 24 hour  Intake   3930 ml  Output   1920 ml  Net   2010 ml    Doppler PT left LE Incisional dressing in place with very minimal drainage No hematomas, compartments soft Heart RRR Lungs CTA B  Assessment/Planning: POD #1Femoral to tibial peroneal trunk bypass using non-reversed ipsilateral vein, left pronfundaplasty and left common femoral endarterectomy  Disposition stable.  Main complaint is pain control.  I will D/C Morphine and add Dilaudid instead. Transfer to 2W. Labs pending   Clinton Gallant [facility] 01/03/2014 7:28 AM -- Warm foot, doppler signals, patent bypass No hematoma Some urinary retention symptoms  I and O cath later today if necessary Saline lock IV Transfer 2 W   Fabienne Bruns, MD [facility] [place] Office: [phone] Pager: [phone]  Laboratory CBC    Component Value Date/Time   WBC 7.4 12/29/2013 1358   RBC 4.00* 12/29/2013 1358   HGB 13.9 12/29/2013 1358   HCT 38.4* 12/29/2013 1358   PLT 200 12/29/2013 1358   MCV 96.0 12/29/2013 1358   MCH 34.8* 12/29/2013 1358   MCHC 36.2* 12/29/2013 1358   RDW 12.7 12/29/2013 1358   LYMPHSABS 2.6 10/08/2013 2030   MONOABS 0.6 10/08/2013 2030   EOSABS 0.8* 10/08/2013 2030   BASOSABS 0.1 10/08/2013 2030     BMET    Component Value Date/Time   NA 138 12/29/2013 1358   K 4.9 12/29/2013 1358   CL 101 12/29/2013 1358   CO2 24 12/29/2013 1358   GLUCOSE 66* 12/29/2013 1358   BUN 11 12/29/2013 1358   CREATININE 0.86 12/29/2013 1358   CALCIUM 10.0 12/29/2013 1358   GFRNONAA 88* 12/29/2013 1358   GFRAA >90 12/29/2013 1358

## 2014-01-03 NOTE — Evaluation (Addendum)
Occupational Therapy Evaluation Patient Details Name: Jeffrey Melendez MRN: [id number] DOB: [birth year] Today's Date: 01/03/2014    History of Present Illness Adm 01/02/14 with left foot ischemia. 11/9 Lt fem-pop bypass graft. PMHx-afib, COPD, CAD, CHF EF 25%, DM, CVA   Clinical Impression   Pt s/p above. Pt independent with ADLs, PTA. Feel pt will benefit from acute OT to increase independence prior to d/c. Feel pt will progress well once pain controlled.     Follow Up Recommendations  Home health OT;Supervision/Assistance - 24 hour    Equipment Recommendations  3 in 1 bedside comode;Tub/shower seat;Other (comment) (AE)    Recommendations for Other Services       Precautions / Restrictions Precautions Precautions: Fall      Mobility Bed Mobility Overal bed mobility: Needs Assistance Bed Mobility: Supine to Sit;Sit to Supine     Supine to sit: Mod assist Sit to supine: Min assist   General bed mobility comments: assist with trunk to sit EOB. Assist with leg to return to bed. Cues for hooking other leg around LLE to help move LLE.  Transfers Overall transfer level: Needs assistance Equipment used: Rolling walker (2 wheeled) Transfers: Sit to/from Stand Sit to Stand: Max assist         General transfer comment: cues for technique. Bed also elevated on second trial. Heavy assist to boost.          ADL Overall ADL's : Needs assistance/impaired     Grooming: Wash/dry hands;Min guard;Standing               Lower Body Dressing: Sit to/from stand;Maximal assistance   Toilet Transfer: Moderate assistance;Ambulation;RW (bed)           Functional mobility during ADLs: Moderate assistance;Rolling walker General ADL Comments: Educated on AE for LB ADLs and pt practiced with reacher and sockaid to donn/doff sock. Reviewed LB dressing technique. Educated on options for shower chair and also educated on multiple uses of 3 in 1. Educated on tub transfer techniques.  Educated on safety (safe shoewear, rugs, use of bag on walker). Decreased balance with standing from bed. Elevated LLE at end of session. Educated on breathing technique.     Vision                     Perception     Praxis      Pertinent Vitals/Pain Pain Assessment: 0-10 Pain Score: 10-Worst pain ever Pain Location: Lt leg Pain Intervention(s): Repositioned;Monitored during session;Patient requesting pain meds-RN notified     Hand Dominance Right   Extremity/Trunk Assessment Upper Extremity Assessment Upper Extremity Assessment: LUE deficits/detail LUE Deficits / Details: unable to fully extend last 3 digits due to previous injury   Lower Extremity Assessment Lower Extremity Assessment: Defer to PT evaluation RLE Deficits / Details: WFL except lack of dorsiflexion (-20) LLE Deficits / Details: ++edema; AROM ankle 45-60 plantarflexion (lacking 45 degrees to achieve neutral); Knee limited by pain (50 degrees) LLE: Unable to fully assess due to pain   Cervical / Trunk Assessment Cervical / Trunk Assessment: Normal   Communication Communication Communication: No difficulties   Cognition Arousal/Alertness: Awake/alert Behavior During Therapy: WFL for tasks assessed/performed Overall Cognitive Status: Within Functional Limits for tasks assessed                     General Comments          Shoulder Instructions      Home Living Family/patient expects to  be discharged to:: Private residence Living Arrangements: Spouse/significant other (girlfriend) Available Help at Discharge: Friend(s);Available 24 hours/day Type of Home: Mobile home Home Access: Stairs to enter Entrance Stairs-Number of Steps: 7 Entrance Stairs-Rails: Right;Left;Can reach both Home Layout: One level     Bathroom Shower/Tub: Chief Strategy OfficerTub/shower unit   Bathroom Toilet: Standard     Home Equipment: Environmental consultantWalker - 2 wheels;Cane - single point   Additional Comments: garden/spa tub--very wide  side to get over to get to shower (only one)      Prior Functioning/Environment Level of Independence: Independent with assistive device(s)        Comments: Uses cane all the time. Still driving, does the grocery shopping and cooking.     OT Diagnosis: Acute pain   OT Problem List: Decreased strength;Decreased range of motion;Decreased activity tolerance;Impaired balance (sitting and/or standing);Decreased knowledge of use of DME or AE;Decreased knowledge of precautions;Pain   OT Treatment/Interventions: Self-care/ADL training;DME and/or AE instruction;Therapeutic activities;Patient/family education;Balance training    OT Goals(Current goals can be found in the care plan section) Acute Rehab OT Goals Patient Stated Goal: not stated OT Goal Formulation: With patient Time For Goal Achievement: 01/10/14 Potential to Achieve Goals: Good ADL Goals Pt Will Perform Lower Body Bathing: with set-up;with supervision;with adaptive equipment;sit to/from stand Pt Will Perform Lower Body Dressing: with set-up;with supervision;with adaptive equipment;sit to/from stand Pt Will Transfer to Toilet: with min guard assist;ambulating (3 in 1 over commode) Pt Will Perform Toileting - Clothing Manipulation and hygiene: with supervision;sit to/from stand  OT Frequency: Min 2X/week   Barriers to D/C:            Co-evaluation              End of Session Equipment Utilized During Treatment: Gait belt;Rolling walker Nurse Communication: Patient requests pain meds  Activity Tolerance: Patient limited by pain Patient left: in bed;with call bell/phone within reach   Time: 1210-1231 OT Time Calculation (min): 21 min Charges:  OT General Charges $OT Visit: 1 Procedure OT Evaluation $Initial OT Evaluation Tier I: 1 Procedure OT Treatments $Self Care/Home Management : 8-22 mins G-CodesEarlie Raveling:    Yidel Teuscher L OTR/L [phone] 01/03/2014, 12:50 PM

## 2014-01-03 NOTE — Progress Notes (Addendum)
Pt's foley removed at 0400. Pt has attempted to void twice since then without success. After 6 hours, pt complained of pressure and urge to void, and bladder scan showed volume of . I&O cath performed and of clear, yellow urine obtained.

## 2014-01-03 NOTE — Evaluation (Signed)
Physical Therapy Evaluation Patient Details Name: Jeffrey Melendez MRN: [id number] DOB: Jan 24, 1946 Today's Date: 01/03/2014   History of Present Illness  Adm 01/02/14 with left foot ischemia. 11/9 Lt fem-pop bypass graft. PMHx-afib, COPD, CAD, CHF EF 25%, DM, CVA  Clinical Impression  Patient is s/p above surgery resulting in functional limitations due to the deficits listed below (see PT Problem List). Pt with significant pain (and edema) LLE. He has 7 steps to enter his home and only his girlfriend to assist him (per pt).  Patient will benefit from skilled PT to increase their independence and safety with mobility to allow discharge to the venue listed below.       Follow Up Recommendations Home health PT;Supervision for mobility/OOB    Equipment Recommendations  None recommended by PT    Recommendations for Other Services OT consult     Precautions / Restrictions Precautions Precautions: Fall      Mobility  Bed Mobility Overal bed mobility: Needs Assistance Bed Mobility: Sit to Supine       Sit to supine: Min assist   General bed mobility comments: assist to raise LLE onto bed due to incr Lt groin pain with attempts; vc for scooting in bed  Transfers Overall transfer level: Needs assistance Equipment used: Rolling walker (2 wheeled) Transfers: Sit to/from Stand Sit to Stand: Min assist         General transfer comment: from recliner with UE support and LLE extended in front; vc for technique  Ambulation/Gait Ambulation/Gait assistance: Min assist Ambulation Distance (Feet): 5 Feet Assistive device: Rolling walker (2 wheeled) Gait Pattern/deviations: Step-to pattern;Decreased stride length;Decreased weight shift to left;Antalgic;Wide base of support Gait velocity: decr   General Gait Details: minimal weightbearing through LLE due to pain; reports tightness in Rt calf (+ lack of AROM dorsiflexion); vc for technique to minimize pain (step-to)  Stairs             Wheelchair Mobility    Modified Rankin (Stroke Patients Only)       Balance Overall balance assessment: Needs assistance         Standing balance support: Bilateral upper extremity supported Standing balance-Leahy Scale: Poor                               Pertinent Vitals/Pain Pain Assessment: 0-10 Pain Score: 9  Pain Location: Lt leg Pain Intervention(s): Limited activity within patient's tolerance;Monitored during session;Premedicated before session;Repositioned    Home Living Family/patient expects to be discharged to:: Private residence Living Arrangements: Spouse/significant other (girlfriend) Available Help at Discharge: Friend(s);Available 24 hours/day Type of Home: Mobile home Home Access: Stairs to enter Entrance Stairs-Rails: Right;Left;Can reach both Entrance Stairs-Number of Steps: 7 Home Layout: One level Home Equipment: Walker - 2 wheels;Cane - single point Additional Comments: garden/spa tub--very wide side to get over to get to shower (only one)    Prior Function Level of Independence: Independent with assistive device(s)         Comments: Uses cane all the time. Still driving, does the grocery shopping and cooking.      Hand Dominance   Dominant Hand: Right    Extremity/Trunk Assessment   Upper Extremity Assessment: Defer to OT evaluation (Lt hand deformity)           Lower Extremity Assessment: LLE deficits/detail;RLE deficits/detail RLE Deficits / Details: WFL except lack of dorsiflexion (-20) LLE Deficits / Details: ++edema; AROM ankle 45-60 plantarflexion (lacking 45  degrees to achieve neutral); Knee limited by pain (50 degrees)  Cervical / Trunk Assessment: Normal  Communication   Communication: No difficulties  Cognition Arousal/Alertness: Awake/alert Behavior During Therapy: WFL for tasks assessed/performed Overall Cognitive Status: Within Functional Limits for tasks assessed                       General Comments General comments (skin integrity, edema, etc.): Emphasis on education to decr edema LLE to assist with decr pain. On entrance, pt sitting with bil feet on the floor. Educated on proper elevation when in bed and asked nursing to reinforce.     Exercises General Exercises - Lower Extremity Ankle Circles/Pumps: AROM;Both;10 reps Quad Sets: AROM;Both;10 reps      Assessment/Plan    PT Assessment Patient needs continued PT services  PT Diagnosis Difficulty walking;Acute pain   PT Problem List Decreased range of motion;Decreased activity tolerance;Decreased balance;Decreased mobility;Decreased knowledge of use of DME;Decreased safety awareness;Pain  PT Treatment Interventions DME instruction;Gait training;Stair training;Functional mobility training;Therapeutic activities;Therapeutic exercise;Patient/family education   PT Goals (Current goals can be found in the Care Plan section) Acute Rehab PT Goals Patient Stated Goal: to go home yesterday PT Goal Formulation: With patient Time For Goal Achievement: 01/06/14 Potential to Achieve Goals: Good    Frequency Min 4X/week   Barriers to discharge        Co-evaluation               End of Session Equipment Utilized During Treatment: Gait belt Activity Tolerance: Patient limited by pain Patient left: in bed;with call bell/phone within reach;with nursing/sitter in room (RN needing to in/out cath pt prior to transfer to reg floor) Nurse Communication: Mobility status (elevation of LLE)         Time: 2620-3559 PT Time Calculation (min) (ACUTE ONLY): 23 min   Charges:   PT Evaluation $Initial PT Evaluation Tier I: 1 Procedure PT Treatments $Therapeutic Exercise: 8-22 mins   PT G Codes:          Jeffrey Melendez 21-Jan-2014, 11:18 AM  2014-01-21  Pager [phone]

## 2014-01-03 NOTE — Progress Notes (Signed)
Utilization review completed.  

## 2014-01-04 LAB — GLUCOSE, CAPILLARY
GLUCOSE-CAPILLARY: 128 mg/dL — AB (ref 70–99)
GLUCOSE-CAPILLARY: 102 mg/dL — AB (ref 70–99)
Glucose-Capillary: 127 mg/dL — ABNORMAL HIGH (ref 70–99)
Glucose-Capillary: 100 mg/dL — ABNORMAL HIGH (ref 70–99)

## 2014-01-04 MED ORDER — ALBUTEROL SULFATE (2.5 MG/3ML) 0.083% IN NEBU
2.5000 mg | INHALATION_SOLUTION | Freq: Four times a day (QID) | RESPIRATORY_TRACT | Status: DC
  Administered 2014-01-04 (×4): 2.5 mg via RESPIRATORY_TRACT
  Filled 2014-01-04 (×5): qty 3

## 2014-01-04 NOTE — Progress Notes (Signed)
PT paged, pt not seen yet and inquiring about time he will be seen. Jeffrey Melendez S 1:46 PM

## 2014-01-04 NOTE — Progress Notes (Signed)
Physical Therapy Treatment Patient Details Name: Jeffrey Melendez MRN: [id number] DOB: [birth year] Today's Date: 01/04/2014    History of Present Illness Adm 01/02/14 with left foot ischemia. 11/9 Lt fem-pop bypass graft. PMHx-afib, COPD, CAD, CHF EF 25%, DM, CVA    PT Comments    Patient is progressing towards physical therapy goals, ambulating a very taxing 75 feet today with min assist while using a rolling walker. Of note, SpO2 was 89% while on room air when PT entered room, dropping to 85% upon standing but increased to 94% while ambulating on 2L supplemental O2 (RN notified.) Tolerates therapeutic exercises well. Plan for stair training tomorrow as pt has 7 steps to climb in order to enter his home. Patient will continue to benefit from skilled physical therapy services to further improve independence with functional mobility.    Follow Up Recommendations  Home health PT;Supervision for mobility/OOB     Equipment Recommendations  None recommended by PT    Recommendations for Other Services OT consult     Precautions / Restrictions Precautions Precautions: Fall    Mobility  Bed Mobility Overal bed mobility: Needs Assistance Bed Mobility: Supine to Sit     Supine to sit: Supervision;HOB elevated        Transfers Overall transfer level: Needs assistance Equipment used: Rolling walker (2 wheeled) Transfers: Sit to/from Stand Sit to Stand: Min assist Stand pivot transfers: Mod assist       General transfer comment: Min assist for boost to stand from recliner. Second attempt pt required close min guard. Cues for hand placement. Pt with plantar flexion initially making it difficult for balance.  Ambulation/Gait Ambulation/Gait assistance: Min guard Ambulation Distance (Feet): 75 Feet Assistive device: Rolling walker (2 wheeled) Gait Pattern/deviations: Step-through pattern;Decreased step length - right;Decreased step length - left;Decreased stride length;Decreased  dorsiflexion - right;Decreased dorsiflexion - left;Antalgic;Trunk flexed;Wide base of support;Step-to pattern (walks on lateral surface of feet) Gait velocity: decr   General Gait Details: Educated on safe DME use with a rolling walker. VC for sequencing, focusing on step through gait pattern. Pt required a rest break while leaning against wall at half-way point of bout. Continues to demonstrate decreased dorsiflexion but was able to achieve foot flat today.   Stairs            Wheelchair Mobility    Modified Rankin (Stroke Patients Only)       Balance Overall balance assessment: Needs assistance Sitting-balance support: Feet supported Sitting balance-Leahy Scale: Good     Standing balance support: Bilateral upper extremity supported Standing balance-Leahy Scale: Poor                      Cognition Arousal/Alertness: Awake/alert Behavior During Therapy: WFL for tasks assessed/performed Overall Cognitive Status: Within Functional Limits for tasks assessed                      Exercises General Exercises - Lower Extremity Ankle Circles/Pumps: AROM;Both;10 reps;Seated Quad Sets: AROM;Both;10 reps;Seated Long Arc Quad: AROM;Both;10 reps;Seated Heel Slides: AROM;Both;10 reps;Seated    General Comments General comments (skin integrity, edema, etc.): See vitals. Discussed d/c planning and goals that will need to be achieved to return home safely.      Pertinent Vitals/Pain Pain Assessment: 0-10 Pain Score: 8  Pain Location: Lt LE Pain Descriptors / Indicators: Aching;Burning Pain Intervention(s): Limited activity within patient's tolerance;Monitored during session;Premedicated before session;Repositioned  SpO2: 89% room air at rest SpO2: 85% room air standing SpO2:  94% 2L supplemental O2 ambulating SpO2: 94% 2L supplemental O2 at rest, end of therapy session    Home Living                      Prior Function            PT Goals  (current goals can now be found in the care plan section) Acute Rehab PT Goals PT Goal Formulation: With patient Time For Goal Achievement: 01/06/14 Potential to Achieve Goals: Good Progress towards PT goals: Progressing toward goals    Frequency  Min 4X/week    PT Plan Current plan remains appropriate    Co-evaluation             End of Session Equipment Utilized During Treatment: Gait belt;Oxygen Activity Tolerance: Patient limited by pain Patient left: with call bell/phone within reach;in chair     Time: 1511-1541 PT Time Calculation (min) (ACUTE ONLY): 30 min  Charges:  $Gait Training: 8-22 mins $Therapeutic Exercise: 8-22 mins                    G Codes:      Jeffrey Melendez 01/30/2014, 4:38 PM  Jeffrey Melendez, [place] [phone]

## 2014-01-04 NOTE — Progress Notes (Addendum)
  [facility] Progress Note  01/04/2014 8:27 AM 2 Days Post-Op  Subjective:  Doing well today. Pain is better. Has not ambulated yet.    Filed Vitals:   01/04/14 0439  BP: 107/61  Pulse: 76  Temp: 97.6 F (36.4 C)  Resp: 16    Physical Exam: Incisions:  Left leg incisions and staple line clean dry and intact. No hematoma of groin. Extremities:  Good left DP/PT doppler signals  CBC    Component Value Date/Time   WBC 7.3 01/03/2014 0958   RBC 3.39* 01/03/2014 0958   HGB 11.5* 01/03/2014 0958   HCT 33.5* 01/03/2014 0958   PLT 165 01/03/2014 0958   MCV 98.8 01/03/2014 0958   MCH 33.9 01/03/2014 0958   MCHC 34.3 01/03/2014 0958   RDW 12.6 01/03/2014 0958   LYMPHSABS 2.6 10/08/2013 2030   MONOABS 0.6 10/08/2013 2030   EOSABS 0.8* 10/08/2013 2030   BASOSABS 0.1 10/08/2013 2030    BMET    Component Value Date/Time   NA 130* 01/03/2014 0958   K 4.1 01/03/2014 0958   CL 93* 01/03/2014 0958   CO2 22 01/03/2014 0958   GLUCOSE 193* 01/03/2014 0958   BUN 7 01/03/2014 0958   CREATININE 0.82 01/03/2014 0958   CALCIUM 8.5 01/03/2014 0958   GFRNONAA >90 01/03/2014 0958   GFRAA >90 01/03/2014 0958    INR    Component Value Date/Time   INR 1.07 12/29/2013 1358     Intake/Output Summary (Last 24 hours) at 01/04/14 0827 Last data filed at 01/04/14 0406  Gross per 24 hour  Intake    840 ml  Output   2075 ml  Net  -1235 ml     Assessment:  68 y.o. male is s/p:  Femoral to tibial peroneal trunk bypass using non-reversed ipsilateral vein, left pronfundaplasty and left common femoral endarterectomy   2 Days Post-Op  Plan: -Patent bypass with good doppler signals. Incisions look good.  -Pain better controlled today. Get OOB and ambulate. -Voiding without difficulty last night and today.  -On eliquis.  -Dispo: Anticipate discharge when ambulating well.    Maris Berger, PA-C [facility] Office: [phone] Pager:  [phone] 01/04/2014 8:27 AM     Moving more this afternoon Incisions healing Doppler DP PT D/c home tomorrow on same home meds  Fabienne Bruns, MD [facility] [place] Office: [phone] Pager: [phone]

## 2014-01-04 NOTE — Progress Notes (Signed)
Occupational Therapy Treatment Patient Details Name: Jeffrey Melendez MRN: [id number] DOB: [birth year] Today's Date: 01/04/2014    History of present illness Adm 01/02/14 with left foot ischemia. 11/9 Lt fem-pop bypass graft. PMHx-afib, COPD, CAD, CHF EF 25%, DM, CVA   OT comments  Pt improving, but continues to be limited by pain.  He is very motivated as he eager to discharge home.  Currently, he requires mod A with LB ADLs, and functional mobility   Follow Up Recommendations  Home health OT;Supervision/Assistance - 24 hour    Equipment Recommendations  3 in 1 bedside comode;Tub/shower seat;Other (comment)    Recommendations for Other Services      Precautions / Restrictions Precautions Precautions: Fall       Mobility Bed Mobility Overal bed mobility: Needs Assistance Bed Mobility: Supine to Sit     Supine to sit: Supervision;HOB elevated        Transfers Overall transfer level: Needs assistance Equipment used: Rolling walker (2 wheeled) Transfers: Sit to/from [employer] Sit to Stand: Mod assist Stand pivot transfers: Mod assist       General transfer comment: Pt requires cues for hand placement and technique as well as sequencing     Balance Overall balance assessment: Needs assistance Sitting-balance support: Feet supported Sitting balance-Leahy Scale: Good     Standing balance support: Bilateral upper extremity supported Standing balance-Leahy Scale: Poor                     ADL Overall ADL's : Needs assistance/impaired             Lower Body Bathing: Moderate assistance;Sit to/from stand       Lower Body Dressing: Moderate assistance;Sit to/from stand Lower Body Dressing Details (indicate cue type and reason): Pt able to pull sock off Lt. LE, mod A to don lt sock.  Mod A to move sit to stand  Toilet Transfer: Moderate assistance;Ambulation;RW   Toileting- Clothing Manipulation and Hygiene: Moderate assistance;Sit  to/from stand       Functional mobility during ADLs: Moderate assistance;Rolling walker General ADL Comments: Pt is very motivated, and eager to discharge home, but knows he has to move better in order to do that.       Vision                     Perception     Praxis      Cognition   Behavior During Therapy: WFL for tasks assessed/performed Overall Cognitive Status: Within Functional Limits for tasks assessed                       Extremity/Trunk Assessment               Exercises     Shoulder Instructions       General Comments      Pertinent Vitals/ Pain       Pain Assessment: 0-10 Pain Score: 9  Pain Location: Lt LE Pain Descriptors / Indicators: Aching;Burning Pain Intervention(s): Monitored during session;Limited activity within patient's tolerance;Repositioned;Patient requesting pain meds-RN notified  Home Living                                          Prior Functioning/Environment              Frequency Min 2X/week  Progress Toward Goals  OT Goals(current goals can now be found in the care plan section)  Progress towards OT goals: Progressing toward goals  ADL Goals Pt Will Perform Lower Body Bathing: with set-up;with supervision;with adaptive equipment;sit to/from stand Pt Will Perform Lower Body Dressing: with set-up;with supervision;with adaptive equipment;sit to/from stand Pt Will Transfer to Toilet: with min guard assist;ambulating Pt Will Perform Toileting - Clothing Manipulation and hygiene: with supervision;sit to/from stand  Plan Discharge plan remains appropriate    Co-evaluation                 End of Session Equipment Utilized During Treatment: Gait belt;Rolling walker   Activity Tolerance Patient limited by pain   Patient Left in chair;with call bell/phone within reach   Nurse Communication Mobility status;Patient requests pain meds        Time: 1411-1438 OT Time  Calculation (min): 27 min  Charges: OT General Charges $OT Visit: 1 Procedure OT Treatments $Self Care/Home Management : 8-22 mins $Therapeutic Activity: 8-22 mins  Guerin Lashomb M 01/04/2014, 2:47 PM

## 2014-01-05 LAB — GLUCOSE, CAPILLARY: Glucose-Capillary: 127 mg/dL — ABNORMAL HIGH (ref 70–99)

## 2014-01-05 MED ORDER — HYDROXYZINE HCL 25 MG PO TABS
25.0000 mg | ORAL_TABLET | Freq: Three times a day (TID) | ORAL | Status: DC | PRN
Start: 2014-01-05 — End: 2014-01-05
  Filled 2014-01-05: qty 1

## 2014-01-05 MED ORDER — OXYCODONE-ACETAMINOPHEN 5-325 MG PO TABS: 1.0000 | ORAL_TABLET | ORAL | Status: DC | PRN

## 2014-01-05 NOTE — Progress Notes (Addendum)
01/05/2014 9:13 AM Nursing note Discharge avs form, medications due today and follow up appointments reviewed. Incision site care and when to call MD reviewed. Pt. Refused to stay long enough to take morning medications states he will take them when he gets home. Pt. Also declined to wait for pneumonia vaccine to arrive from pharmacy. States he will get this done after discharge. Pt. States he has a walker at home. HH RN and PT/OT orders placed in EPIC per verbal telephone order Lianne Cure PAC. Amended dosing instructions for oxycodone and hydroxyzine added to avs per Dr. Darrick Penna. D/c iv line. D/c tele. D/c home per orders.

## 2014-01-05 NOTE — Care Management Note (Signed)
    Page 1 of 1   01/05/2014     11:14:27 AM CARE MANAGEMENT NOTE 01/05/2014  Patient:  Jeffrey Melendez, Jeffrey Melendez   Account Number:  [id number]  Date Initiated:  01/05/2014  Documentation initiated by:  Felissa Blouch  Subjective/Objective Assessment:   Pt s/p Lt fem tib bypass and endarterectomy on 01/02/14. PTA, pt independent, resides at home with family.     Action/Plan:   Will need HH follow up at dc.  PA notified for orders.   Anticipated DC Date:  01/05/2014   Anticipated DC Plan:  HOME W HOME HEALTH SERVICES      DC Planning Services  CM consult      [facility]  HOME HEALTH   Choice offered to / List presented to:  C-1 Patient        HH arranged  HH-1 RN  HH-2 PT  HH-3 OT      [facility] agency  [facility]   Status of service:  Completed, signed off Medicare Important Message given?   (If response is "NO", the following Medicare IM given date fields will be blank) Date Medicare IM given:   Medicare IM given by:   Date Additional Medicare IM given:   Additional Medicare IM given by:    Discharge Disposition:  HOME W HOME HEALTH SERVICES  Per UR Regulation:  Reviewed for med. necessity/level of care/duration of stay  If discussed at Long Length of Stay Meetings, dates discussed:    Comments:  01/05/14 Sidney Ace, RN, BSN [phone] Referral to [facility], per pt choice.  Start of care 24-48h post dc date.  Pt has RW for home use

## 2014-01-05 NOTE — Progress Notes (Addendum)
Physical Therapy Treatment Patient Details Name: Roland EarlRoy A Sami MRN: [id number] DOB: [birth year] Today's Date: 01/05/2014    History of Present Illness Adm 01/02/14 with left foot ischemia. 11/9 Lt fem-pop bypass graft. PMHx-afib, COPD, CAD, CHF EF 25%, DM, CVA    PT Comments    Patient continues to be motivated to get out of the hospital but is apprehensive about ascending and descending stairs.  SpO2 89% on RA at rest in supine, fell to 84% in sitting.  With 2L supplemental O2, SpO2 rose to 92%, which was maintained throughout treatment.  P.T. transported patient to stairwell in rolling chair in order to conserve energy to attempt ascent/descent of steps.  Pt was able to ascend and descend 6 stairs in a side-stepping fashion while using bilateral UE support on left handrail.  Pain was worse on descent in bilateral LE.  Patient would continue to benefit from skilled therapy in order to increase independence at home with ambulation, stairs, and ADLs.     Follow Up Recommendations  Home health PT;Supervision for mobility/OOB     Equipment Recommendations  None recommended by PT    Recommendations for Other Services OT consult     Precautions / Restrictions Precautions Precautions: Fall Restrictions Weight Bearing Restrictions: No    Mobility  Bed Mobility Overal bed mobility: Needs Assistance Bed Mobility: Supine to Sit     Supine to sit: Min assist     General bed mobility comments: assist with trunk and lower extremities to sit EOB   Transfers Overall transfer level: Needs assistance Equipment used: Rolling walker (2 wheeled) Transfers: Sit to/from Stand Sit to Stand: Min assist         General transfer comment: Min assist to stand from bed and maintain balance in standing.  Verbal cues for posture to increase stability and for decreased plantarflexion of right foot.  Significantly decreased balance in standing.  Ambulation/Gait Ambulation/Gait assistance: Min  assist Ambulation Distance (Feet): 30 Feet Assistive device: Rolling walker (2 wheeled) Gait Pattern/deviations: Decreased step length - right;Decreased step length - left;Decreased dorsiflexion - right;Decreased dorsiflexion - left;Antalgic;Trunk flexed Gait velocity: decr   General Gait Details: Reinforced safe use of RW.  Verbal cues required for correct gait pattern and posture.  Balance during gait continues to be problematic.   Stairs Stairs: Yes Stairs assistance: Min assist Stair Management: One rail Left Number of Stairs: 6 General stair comments: Pt side-stepped up and down stairs using both hands to assist using railing.  Descent very painful.  Wheelchair Mobility    Modified Rankin (Stroke Patients Only)       Balance Overall balance assessment: Needs assistance Sitting-balance support: Feet supported Sitting balance-Leahy Scale: Good     Standing balance support: Bilateral upper extremity supported Standing balance-Leahy Scale: Poor Standing balance comment: Patient requires 15-30 seconds with contact guard assist to regain balance when standing from a seated position with bilateral UE support.                    Cognition Arousal/Alertness: Awake/alert Behavior During Therapy: WFL for tasks assessed/performed Overall Cognitive Status: Within Functional Limits for tasks assessed                      Exercises Total Joint Exercises Ankle Circles/Pumps: AROM;Seated;Both;20 reps Long Arc Quad: AROM;Both;20 reps;Seated General Exercises - Lower Extremity Ankle Circles/Pumps: AROM;Seated;Both;20 reps Long Arc Quad: AROM;Seated;Both;20 reps    General Comments        Pertinent Vitals/Pain  Pain Assessment: 0-10 Pain Location: Bilateral LE Pain Descriptors / Indicators: Throbbing Pain Intervention(s): Limited activity within patient's tolerance;Monitored during session;Premedicated before session  SpO2: 89% supine, 84% sitting (RA) SpO2:  92% standing & seated (2L nasal cannula) HR: 84    Home Living                      Prior Function            PT Goals (current goals can now be found in the care plan section) Acute Rehab PT Goals Patient Stated Goal: to get home and get back to using just a cane for ambulation PT Goal Formulation: With patient Time For Goal Achievement: 01/19/14 Potential to Achieve Goals: Good Progress towards PT goals: Progressing toward goals    Frequency  Min 4X/week    PT Plan Current plan remains appropriate (recommend Home Health PT after discharge)    Co-evaluation             End of Session Equipment Utilized During Treatment: Gait belt;Oxygen Activity Tolerance: Patient limited by pain Patient left: in chair;with family/visitor present;with call bell/phone within reach     Time: 0912-0952 PT Time Calculation (min) (ACUTE ONLY): 40 min  Charges:                       G Codes:      Pearlina Friedly SPT 01/05/2014, 11:24 AM

## 2014-01-05 NOTE — Progress Notes (Signed)
[facility]  Subjective  - No complaints ready to go   Objective 120/56 79 98.1 F (36.7 C) (Oral) 18 91%  Intake/Output Summary (Last 24 hours) at 01/05/14 0759 Last data filed at 01/05/14 0631  Gross per 24 hour  Intake    480 ml  Output   3000 ml  Net  -2520 ml   Incisions healing Left foot pink and warm  Assessment/Planning: D/c home today  FIELDS,CHARLES E 01/05/2014 7:59 AM --  Laboratory Lab Results:  Recent Labs  01/03/14 0958  WBC 7.3  HGB 11.5*  HCT 33.5*  PLT 165   BMET  Recent Labs  01/03/14 0958  NA 130*  K 4.1  CL 93*  CO2 22  GLUCOSE 193*  BUN 7  CREATININE 0.82  CALCIUM 8.5    COAG Lab Results  Component Value Date   INR 1.07 12/29/2013   INR 0.88 06/03/2009   No results found for: PTT

## 2014-01-05 NOTE — Plan of Care (Signed)
Problem: Phase II Progression Outcomes Goal: Pain controlled Outcome: Completed/Met Date Met:  01/05/14

## 2014-01-06 ENCOUNTER — Telehealth: Payer: Self-pay | Admitting: Vascular Surgery

## 2014-01-06 NOTE — Telephone Encounter (Addendum)
-----   Message from Sharee Pimple, RN sent at 01/05/2014  9:38 AM EST ----- Regarding: Schedule   ----- Message -----    From: Lars Mage, PA-C    Sent: 01/05/2014   8:16 AM      To: Vvs Charge Pool  F/U in 2 weeks with Dr. Darrick Penna s/p fem-pop left    01/06/14: lm for pt re appt, made aware in msg to call if problems arise before appt, dm

## 2014-01-06 NOTE — Discharge Summary (Signed)
[facility] Discharge Summary   Patient ID:  Jeffrey Melendez MRN: [id number] DOB/AGE: 67-May-1947 67 y.o.  Admit date: 01/02/2014 Discharge date: 01/06/2014 Date of Surgery: 01/02/2014 Surgeon: Surgeon(s): Sherren Kerns, MD  Admission Diagnosis: Left foot rest pain with bilateral lower extremity claudication I70.222; I70.213  Discharge Diagnoses:  Left foot rest pain with bilateral lower extremity claudication I70.222; I70.213  Secondary Diagnoses: Past Medical History  Diagnosis Date  . COPD (chronic obstructive pulmonary disease)   . Myocardial infarction   . Stroke   . PAD (peripheral artery disease)   . CHF (congestive heart failure)     systolic  . Hyperlipidemia   . Anxiety   . Ischemic cardiomyopathy   . Diabetes mellitus without complication   . PAF (paroxysmal atrial fibrillation)   . Coronary artery disease     [facility]  . Hypertension   . Dysrhythmia   . A-fib   . Asthma   . Pneumonia   . Depression   . GERD (gastroesophageal reflux disease)   . Headache   . Arthritis   . Cataract     left eye    Procedure(s): BYPASS GRAFT FEMORAL- TIBIAL TO PERONEAL BYPASS GRAFT USING NON-REVERSED IPSILATERAL SAPHENOUS VEIN INTRA OPERATIVE ARTERIOGRAM PATCH ANGIOPLASTY - Left profundaplasty ENDARTERECTOMY LEFT COMMON FEMORAL ARTERY   Discharged Condition: good  HPI: Patient is a 68 y.o. year old male who presents for evaluation of rest pain left foot. He also has pain in his left calf and sometimes his right which occurs at 1 block. He has atrial fibrillation and is on Eliquis. He is also on aspirin. he recently underwent a lower extremity arteriogram on September 4. 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  ABI from [facility] dated 8/17 left 0.3, right 0.5. Vein mapping ultrasound today showed left greater saphenous vein 3-5 mm above the knee 2.5-3 mm below the  knee.     Hospital Course:  Jeffrey Melendez is a 68 y.o. male is S/P Left Procedure(s): BYPASS GRAFT FEMORAL- TIBIAL TO PERONEAL BYPASS GRAFT USING NON-REVERSED IPSILATERAL SAPHENOUS VEIN INTRA OPERATIVE ARTERIOGRAM PATCH ANGIOPLASTY - Left profundaplasty ENDARTERECTOMY LEFT COMMON FEMORAL ARTERY   Post -op mobility decreased secondary to pain control.  Home health ordered for continued mobility and safety.   He will follow up in 2 weeks.  ABI will be check at office visit in the future. Good left DP/PT doppler signals.  Incisions healing well.    Significant Diagnostic Studies: CBC Lab Results  Component Value Date   WBC 7.3 01/03/2014   HGB 11.5* 01/03/2014   HCT 33.5* 01/03/2014   MCV 98.8 01/03/2014   PLT 165 01/03/2014    BMET    Component Value Date/Time   NA 130* 01/03/2014 0958   K 4.1 01/03/2014 0958   CL 93* 01/03/2014 0958   CO2 22 01/03/2014 0958   GLUCOSE 193* 01/03/2014 0958   BUN 7 01/03/2014 0958   CREATININE 0.82 01/03/2014 0958   CALCIUM 8.5 01/03/2014 0958   GFRNONAA >90 01/03/2014 0958   GFRAA >90 01/03/2014 0958   COAG Lab Results  Component Value Date   INR 1.07 12/29/2013   INR 0.88 06/03/2009     Disposition:  Discharge to :Home Discharge Instructions    Call MD for:  redness, tenderness, or signs of infection (pain, swelling, bleeding, redness, odor or green/yellow discharge around incision site)    Complete by:  As directed  Call MD for:  severe or increased pain, loss or decreased feeling  in affected limb(s)    Complete by:  As directed      Call MD for:  temperature >100.5    Complete by:  As directed      Discharge instructions    Complete by:  As directed   You may shower with soap and water daily     Driving Restrictions    Complete by:  As directed   No driving for 1-2 weeks or until you no longer need pain medication.     Increase activity slowly    Complete by:  As directed   Walk with assistance use walker or cane as  needed     Lifting restrictions    Complete by:  As directed   No lifting for 6 weeks     Resume previous diet    Complete by:  As directed             Medication List    STOP taking these medications        HYDROcodone-acetaminophen 5-325 MG per tablet  Commonly known as:  NORCO/VICODIN      TAKE these medications        albuterol 108 (90 BASE) MCG/ACT inhaler  Commonly known as:  PROVENTIL HFA;VENTOLIN HFA  Inhale 2 puffs into the lungs 4 (four) times daily as needed for wheezing or shortness of breath.     albuterol (2.5 MG/3ML) 0.083% nebulizer solution  Commonly known as:  PROVENTIL  Take 2.5 mg by nebulization 2 (two) times daily.     ALPRAZolam 1 MG tablet  Commonly known as:  XANAX  Take 1 mg by mouth 2 (two) times daily as needed for anxiety.     amLODipine 5 MG tablet  Commonly known as:  NORVASC  Take 5 mg by mouth daily.     apixaban 5 MG Tabs tablet  Commonly known as:  ELIQUIS  Take 1 tablet (5 mg total) by mouth 2 (two) times daily.     aspirin EC 81 MG tablet  Take 81 mg by mouth daily.     budesonide-formoterol 160-4.5 MCG/ACT inhaler  Commonly known as:  SYMBICORT  Inhale 1 puff into the lungs 2 (two) times daily.     cholecalciferol 1000 UNITS tablet  Commonly known as:  VITAMIN D  Take 1,000 Units by mouth 2 (two) times daily.     citalopram 40 MG tablet  Commonly known as:  CELEXA  Take 20 mg by mouth daily.     felodipine 5 MG 24 hr tablet  Commonly known as:  PLENDIL  Take 1 tablet (5 mg total) by mouth daily.     Fish Oil 1000 MG Caps  Take 1,000 mg by mouth 2 (two) times daily.     fluocinonide 0.05 % external solution  Commonly known as:  LIDEX  Apply 1 application topically 3 (three) times daily.     fluticasone 110 MCG/ACT inhaler  Commonly known as:  FLOVENT HFA  Inhale 2 puffs into the lungs 2 (two) times daily.     fluticasone 50 MCG/ACT nasal spray  Commonly known as:  FLONASE  Place 2 sprays into both nostrils 2  (two) times daily.     folic acid 1 MG tablet  Commonly known as:  FOLVITE  Take 1 tablet (1 mg total) by mouth daily.     glyBURIDE 1.25 MG tablet  Commonly known as:  DIABETA  Take  1.25 mg by mouth 2 (two) times daily.     hydrocortisone 2.5 % lotion  Apply 1 application topically 3 (three) times daily.     hydrOXYzine 25 MG tablet  Commonly known as:  ATARAX/VISTARIL  Take 25 mg by mouth 3 (three) times daily as needed for anxiety.     ketoconazole 2 % shampoo  Commonly known as:  NIZORAL  Apply 1 application topically 3 (three) times a week.     lisinopril 10 MG tablet  Commonly known as:  PRINIVIL,ZESTRIL  Take 10 mg by mouth daily.     metoprolol succinate 100 MG 24 hr tablet  Commonly known as:  TOPROL-XL  Take 100 mg by mouth daily. Take with or immediately following a meal.     mometasone 220 MCG/INH inhaler  Commonly known as:  ASMANEX  Inhale 1 puff into the lungs 4 (four) times daily.     multivitamin tablet  Take 1 tablet by mouth daily.     nitroGLYCERIN 0.4 mg/hr patch  Commonly known as:  NITRODUR - Dosed in mg/24 hr  Place 0.4 mg onto the skin daily.     nitroGLYCERIN 0.4 MG SL tablet  Commonly known as:  NITROSTAT  Place 0.4 mg under the tongue every 5 (five) minutes as needed. For chest pain     oxyCODONE 5 MG immediate release tablet  Commonly known as:  Oxy IR/ROXICODONE  Take 1 tablet (5 mg total) by mouth every 4 (four) hours as needed for moderate pain.     oxyCODONE-acetaminophen 5-325 MG per tablet  Commonly known as:  PERCOCET/ROXICET  Take 1 tablet by mouth every 4 (four) hours as needed for moderate pain.     pantoprazole 40 MG tablet  Commonly known as:  PROTONIX  Take 40 mg by mouth daily.     potassium chloride 10 MEQ tablet  Commonly known as:  K-DUR,KLOR-CON  Take 10 mEq by mouth daily.     rosuvastatin 40 MG tablet  Commonly known as:  CRESTOR  Take 40 mg by mouth daily.     tadalafil 5 MG tablet  Commonly known as:   CIALIS  Take 5 mg by mouth daily as needed for erectile dysfunction.     THIAMINE PO  Take 1 tablet by mouth daily.     traZODone 100 MG tablet  Commonly known as:  DESYREL  Take 100 mg by mouth at bedtime as needed for sleep.     vitamin E 400 UNIT capsule  Generic drug:  vitamin E  Take 400 Units by mouth daily.       Verbal and written Discharge instructions given to the patient. Wound care per Discharge AVS     Follow-up Information    Follow up with Sherren Kerns, MD In 2 weeks.   Specialty:  Vascular Surgery   Why:  sent message to office   Contact information:   [address] [place] [address] [phone]       Signed: Clinton Gallant [facility] 01/06/2014, 9:15 AM  - For VQI Registry use --- Instructions: Press F2 to tab through selections.  Delete question if not applicable.   Post-op:  Wound infection: No  Graft infection: No  Transfusion: No  If yes, 0 units given New Arrhythmia: No Ipsilateral amputation: [ x] no, [ ]  Minor, [ ]  BKA, [ ]  AKA Discharge patency: [ ]  Primary, [ ]  Primary assisted, [ ]  Secondary, [ ]  Occluded Patency judged by: [ x] Dopper only, [ ]   Palpable graft pulse, [ ]  Palpable distal pulse, [ ]  ABI inc. > 0.15, [ ]  Duplex D/C Ambulatory Status: Ambulatory with walker  Complications: MI: [x ] No, [ ]  Troponin only, [ ]  EKG or Clinical CHF: No Resp failure: [x ] none, [ ]  Pneumonia, [ ]  Ventilator Chg in renal function: [x ] none, [ ]  Inc. Cr > 0.5, [ ]  Temp. Dialysis, [ ]  Permanent dialysis Stroke: [x ] None, [ ]  Minor, [ ]  Major Return to OR: No  Reason for return to OR: [ ]  Bleeding, [ ]  Infection, [ ]  Thrombosis, [ ]  Revision  Discharge medications: Statin use:  Yes ASA use:  Yes Plavix use:  No  for medical reason   Beta blocker use: Yes Coumadin use: No  for medical reason on Eliquis

## 2014-01-13 ENCOUNTER — Telehealth: Payer: Self-pay | Admitting: *Deleted

## 2014-01-13 NOTE — Telephone Encounter (Signed)
Jeanice Lim, [facility] nurse with [facility], called to report that Mr. Immerman is have some increased redness at every incision site; it is only around the stapled areas. He is afebrile and has no drainage or other signs of infections. He does have 2+ nonpitting edema in the leg but no signs of DVT per [facility]. She will instruct patient on wound care, the need to ambulate and elevation. The nurse will see him 3 times a week until seen by Dr. Darrick Penna on 01-26-14 ( Because of the Thanksgiving holiday). She will notify us if any changes or signs of infection or DVT.

## 2014-01-18 ENCOUNTER — Telehealth: Payer: Self-pay

## 2014-01-18 DIAGNOSIS — G8918 Other acute postprocedural pain: Secondary | ICD-10-CM

## 2014-01-18 MED ORDER — OXYCODONE-ACETAMINOPHEN 5-325 MG PO TABS: 1.0000 | ORAL_TABLET | ORAL | Status: AC | PRN

## 2014-01-18 NOTE — Telephone Encounter (Signed)
Phone call from [facility] RN stating pt. needs refill on pain medication; reported he "has been making them stretch."  Phone call to pt.  Reported pain level at 6/10; stated "my stitches are pulling like crazy".  Reported the incisions look okay; denied any increased redness/ inflammation.  Denied any fever/ chills.  Discussed with S. Nickel NP.  Gave consent to order Oxycodone/ Acetaminophen 5/325 mg, 1 tab q 4hr prn/ pain; #20; no refills.  Advised pt. he will need to pick up Rx at the office and take to pharmacy.  Verb. Understanding.

## 2014-01-25 ENCOUNTER — Encounter: Payer: Self-pay | Admitting: Vascular Surgery

## 2014-01-26 ENCOUNTER — Ambulatory Visit (INDEPENDENT_AMBULATORY_CARE_PROVIDER_SITE_OTHER): Payer: Self-pay | Admitting: Vascular Surgery

## 2014-01-26 ENCOUNTER — Encounter: Payer: Self-pay | Admitting: Vascular Surgery

## 2014-01-26 VITALS — BP 121/72 | HR 69 | Resp 18 | Ht 66.5 in | Wt 163.0 lb

## 2014-01-26 DIAGNOSIS — I739 Peripheral vascular disease, unspecified: Secondary | ICD-10-CM

## 2014-01-26 NOTE — Addendum Note (Signed)
Addended by: Sharee Pimple on: 01/26/2014 04:42 PM   Modules accepted: Orders

## 2014-01-26 NOTE — Progress Notes (Signed)
Patient is a 68 year old male who returns for follow-up today. He underwent left common femoral endarterectomy with profundoplasty and left femoral to tibioperoneal trunk bypass with some lateral saphenous vein on 01/02/2014. He states the rest pain in his left foot has resolved. He does still has some soreness in the leg overall. All of the staples were removed from his left lower extremity today. He denies any significant drainage from his incisions. He is trying to quit smoking.  Physcal exam:  Filed Vitals:   01/26/14 1217  BP: 121/72  Pulse: 69  Resp: 18  Height: 5' 6.5" (1.689 m)  Weight: 163 lb (73.936 kg)    Left lower extremity: All incisions healing well some surrounding erythema from his staples no palpable pedal pulses 2+ popliteal pulse  Assessment: Patent bypass graft left leg. Healing incisions.  Plan: Follow-up duplex scan in 2 months with ABIs. Patient will again try to quit smoking.  Fabienne Bruns, MD [facility] [place] Office: [phone] Pager: [phone]

## 2014-02-02 ENCOUNTER — Encounter (HOSPITAL_COMMUNITY): Payer: Self-pay | Admitting: Vascular Surgery

## 2014-04-06 ENCOUNTER — Encounter (HOSPITAL_COMMUNITY): Payer: Medicare HMO

## 2014-04-06 ENCOUNTER — Other Ambulatory Visit (HOSPITAL_COMMUNITY): Payer: Medicare HMO

## 2014-04-06 ENCOUNTER — Ambulatory Visit: Payer: Medicare HMO | Admitting: Vascular Surgery

## 2015-07-10 IMAGING — CR DG TIBIA/FIBULA PORT 2V*L*
1 series · 1 of 1 positions shown · non-contrast
Comparison: None.

CLINICAL DATA: Femoral to tibial bypass graft

EXAM:
PORTABLE LEFT TIBIA AND FIBULA - 2 VIEW

[AP]
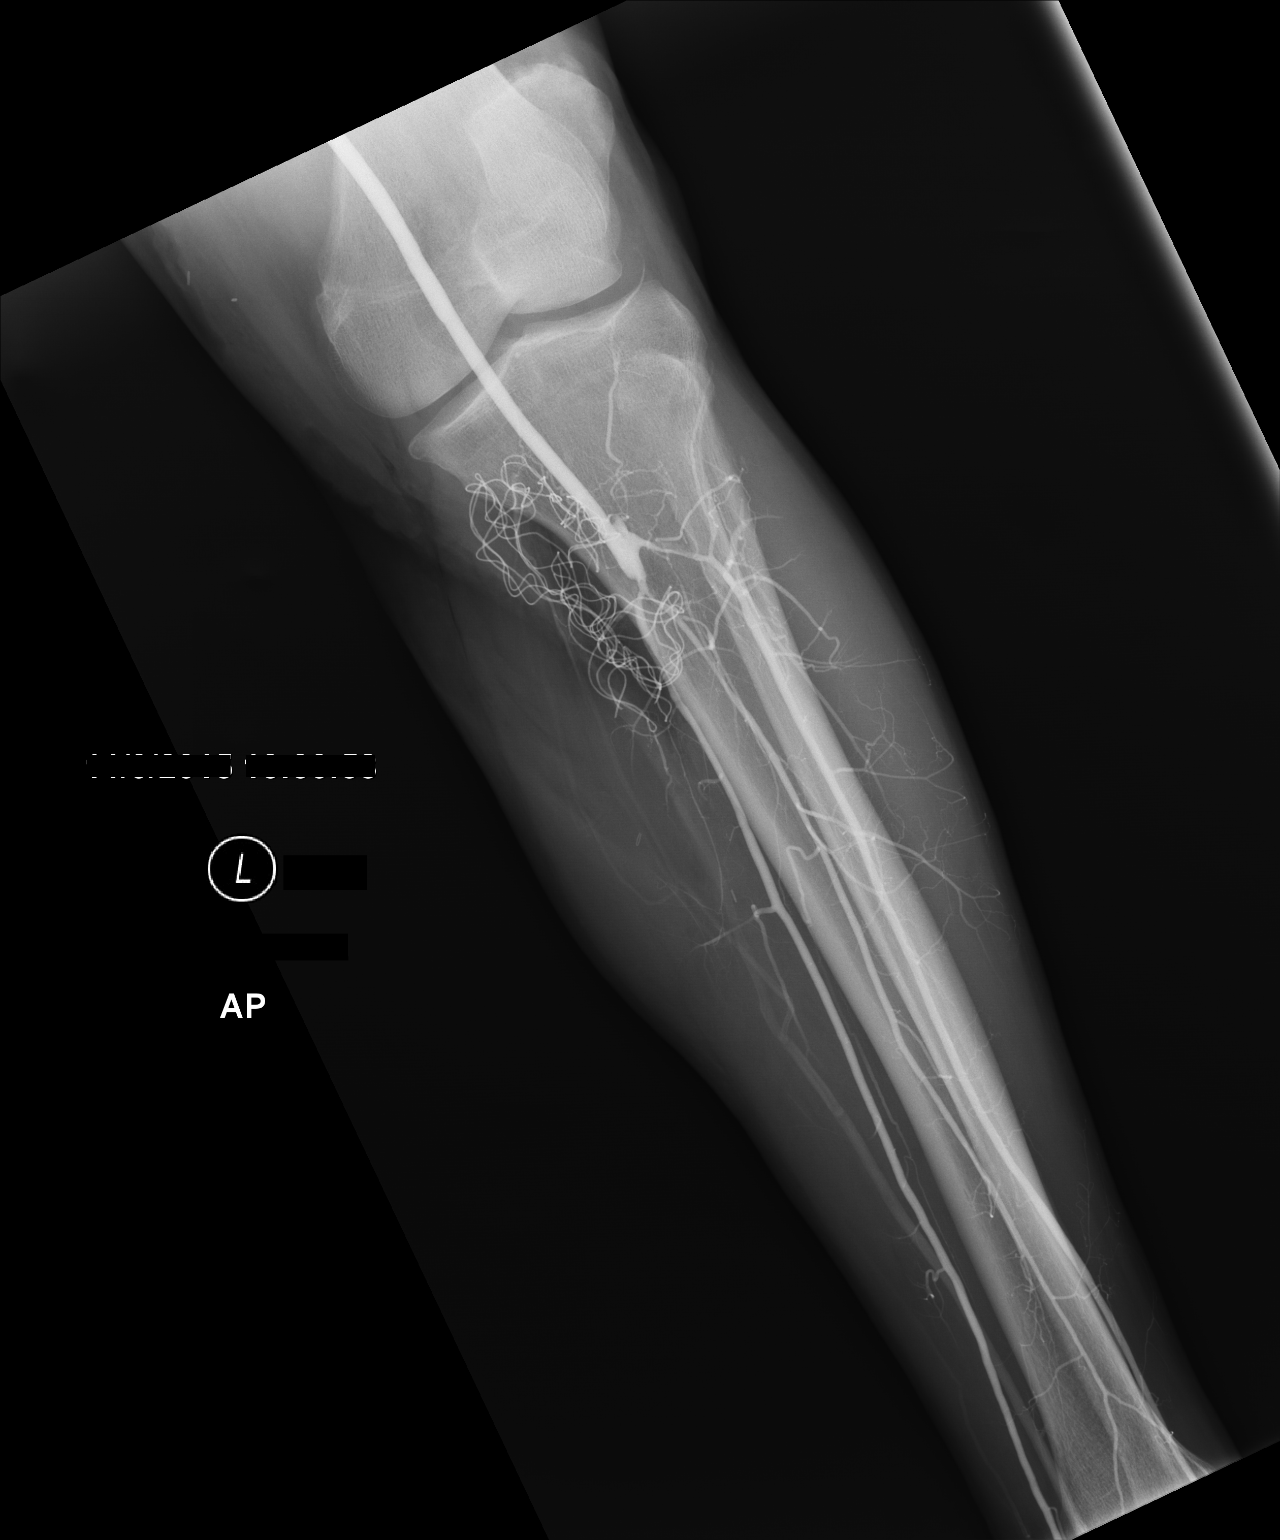

[1 of 1 positions shown; findings below may reference images not displayed]

FINDINGS: A single spot film was obtained intraoperatively at the completion
of a femoral to tibial bypass graft. The graft is patent. The distal
anastomosis is patent with the touchdown site at the tibioperoneal
trunk just below the origin of the anterior tibial artery.
Three-vessel runoff to the level of the ankle is seen.
IMPRESSION: Femoral to tibioperoneal trunk bypass

## 2017-03-27 DEATH — deceased
